# Patient Record
Sex: Female | Born: 1985
Health system: Southern US, Community
[De-identification: ages and names within clinical notes are randomized; demographics above are authoritative.]

## PROBLEM LIST (undated history)

## (undated) DIAGNOSIS — Z8489 Family history of other specified conditions: Secondary | ICD-10-CM

## (undated) DIAGNOSIS — T8859XA Other complications of anesthesia, initial encounter: Secondary | ICD-10-CM

## (undated) DIAGNOSIS — R519 Headache, unspecified: Secondary | ICD-10-CM

## (undated) DIAGNOSIS — M5126 Other intervertebral disc displacement, lumbar region: Secondary | ICD-10-CM

## (undated) DIAGNOSIS — M5136 Other intervertebral disc degeneration, lumbar region: Secondary | ICD-10-CM

## (undated) DIAGNOSIS — M51369 Other intervertebral disc degeneration, lumbar region without mention of lumbar back pain or lower extremity pain: Secondary | ICD-10-CM

## (undated) DIAGNOSIS — R51 Headache: Secondary | ICD-10-CM

## (undated) DIAGNOSIS — R112 Nausea with vomiting, unspecified: Secondary | ICD-10-CM

## (undated) DIAGNOSIS — F419 Anxiety disorder, unspecified: Secondary | ICD-10-CM

## (undated) DIAGNOSIS — T7840XA Allergy, unspecified, initial encounter: Secondary | ICD-10-CM

## (undated) DIAGNOSIS — T4145XA Adverse effect of unspecified anesthetic, initial encounter: Secondary | ICD-10-CM

## (undated) DIAGNOSIS — S3992XA Unspecified injury of lower back, initial encounter: Secondary | ICD-10-CM

## (undated) DIAGNOSIS — Z9889 Other specified postprocedural states: Secondary | ICD-10-CM

## (undated) DIAGNOSIS — K219 Gastro-esophageal reflux disease without esophagitis: Secondary | ICD-10-CM

## (undated) HISTORY — PX: BREAST SURGERY: SHX581

## (undated) HISTORY — DX: Other intervertebral disc degeneration, lumbar region: M51.36

## (undated) HISTORY — DX: Allergy, unspecified, initial encounter: T78.40XA

## (undated) HISTORY — DX: Other intervertebral disc displacement, lumbar region: M51.26

## (undated) HISTORY — DX: Other intervertebral disc degeneration, lumbar region without mention of lumbar back pain or lower extremity pain: M51.369

---
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
## 2010-12-18 ENCOUNTER — Emergency Department: Payer: Self-pay | Admitting: Emergency Medicine

## 2011-03-18 ENCOUNTER — Emergency Department: Payer: Self-pay | Admitting: Emergency Medicine

## 2012-05-03 ENCOUNTER — Emergency Department: Payer: Self-pay | Admitting: Emergency Medicine

## 2013-02-28 ENCOUNTER — Emergency Department: Payer: Self-pay | Admitting: Emergency Medicine

## 2013-02-28 LAB — CBC
HCT: 41.7 % (ref 35.0–47.0)
MCH: 29.6 pg (ref 26.0–34.0)
MCHC: 33.8 g/dL (ref 32.0–36.0)
RBC: 4.76 10*6/uL (ref 3.80–5.20)
RDW: 13.4 % (ref 11.5–14.5)
WBC: 7 10*3/uL (ref 3.6–11.0)

## 2013-02-28 LAB — BASIC METABOLIC PANEL
Anion Gap: 10 (ref 7–16)
BUN: 15 mg/dL (ref 7–18)
Calcium, Total: 9.6 mg/dL (ref 8.5–10.1)
Co2: 24 mmol/L (ref 21–32)
EGFR (Non-African Amer.): >60
Glucose: 81 mg/dL (ref 65–99)
Osmolality: 285 (ref 275–301)
Potassium: 3.3 mmol/L — ABNORMAL LOW (ref 3.5–5.1)

## 2013-02-28 LAB — MAGNESIUM: Magnesium: 2 mg/dL

## 2013-04-11 ENCOUNTER — Emergency Department: Payer: Self-pay | Admitting: Emergency Medicine

## 2013-04-11 LAB — URINALYSIS, COMPLETE
Bacteria: NONE SEEN
Bilirubin,UR: NEGATIVE
Blood: NEGATIVE
Glucose,UR: NEGATIVE mg/dL (ref 0–75)
LEUKOCYTE ESTERASE: NEGATIVE
Nitrite: NEGATIVE
PROTEIN: NEGATIVE
Ph: 6 (ref 4.5–8.0)
SPECIFIC GRAVITY: 1.031 (ref 1.003–1.030)
Squamous Epithelial: 3
WBC UR: 2 /HPF (ref 0–5)

## 2013-04-12 LAB — CBC WITH DIFFERENTIAL/PLATELET
BASOS PCT: 1.2 %
Basophil #: 0.1 10*3/uL (ref 0.0–0.1)
Eosinophil #: 0.3 10*3/uL (ref 0.0–0.7)
Eosinophil %: 3.8 %
HCT: 41.7 % (ref 35.0–47.0)
HGB: 13.9 g/dL (ref 12.0–16.0)
LYMPHS ABS: 3 10*3/uL (ref 1.0–3.6)
Lymphocyte %: 36 %
MCH: 30.2 pg (ref 26.0–34.0)
MCHC: 33.3 g/dL (ref 32.0–36.0)
MCV: 91 fL (ref 80–100)
Monocyte #: 0.7 x10 3/mm (ref 0.2–0.9)
Monocyte %: 8.6 %
NEUTROS ABS: 4.3 10*3/uL (ref 1.4–6.5)
Neutrophil %: 50.4 %
Platelet: 250 10*3/uL (ref 150–440)
RBC: 4.61 10*6/uL (ref 3.80–5.20)
RDW: 13.9 % (ref 11.5–14.5)
WBC: 8.4 10*3/uL (ref 3.6–11.0)

## 2013-04-12 LAB — COMPREHENSIVE METABOLIC PANEL
ALK PHOS: 64 U/L
Albumin: 3.8 g/dL (ref 3.4–5.0)
Anion Gap: 2 — ABNORMAL LOW (ref 7–16)
BUN: 14 mg/dL (ref 7–18)
Bilirubin,Total: 0.2 mg/dL (ref 0.2–1.0)
CHLORIDE: 106 mmol/L (ref 98–107)
Calcium, Total: 8.9 mg/dL (ref 8.5–10.1)
Co2: 29 mmol/L (ref 21–32)
Creatinine: 0.92 mg/dL (ref 0.60–1.30)
EGFR (Non-African Amer.): >60
Glucose: 74 mg/dL (ref 65–99)
Osmolality: 273 (ref 275–301)
Potassium: 3.5 mmol/L (ref 3.5–5.1)
SGOT(AST): 28 U/L (ref 15–37)
SGPT (ALT): 30 U/L (ref 12–78)
Sodium: 137 mmol/L (ref 136–145)
Total Protein: 6.6 g/dL (ref 6.4–8.2)

## 2013-04-12 LAB — LIPASE, BLOOD: LIPASE: 1006 U/L — AB (ref 73–393)

## 2013-05-07 ENCOUNTER — Emergency Department: Payer: Self-pay | Admitting: Internal Medicine

## 2013-05-07 LAB — URINALYSIS, COMPLETE
BILIRUBIN, UR: NEGATIVE
Bacteria: NONE SEEN
Blood: NEGATIVE
Glucose,UR: NEGATIVE mg/dL (ref 0–75)
NITRITE: NEGATIVE
PH: 5 (ref 4.5–8.0)
Specific Gravity: 1.028 (ref 1.003–1.030)
Squamous Epithelial: 8
WBC UR: 28 /HPF (ref 0–5)

## 2013-05-07 LAB — COMPREHENSIVE METABOLIC PANEL
ALBUMIN: 3.5 g/dL (ref 3.4–5.0)
ALK PHOS: 54 U/L
Anion Gap: 3 — ABNORMAL LOW (ref 7–16)
BILIRUBIN TOTAL: 0.5 mg/dL (ref 0.2–1.0)
BUN: 15 mg/dL (ref 7–18)
CHLORIDE: 106 mmol/L (ref 98–107)
Calcium, Total: 9 mg/dL (ref 8.5–10.1)
Co2: 28 mmol/L (ref 21–32)
Creatinine: 0.91 mg/dL (ref 0.60–1.30)
EGFR (African American): >60
EGFR (Non-African Amer.): >60
Glucose: 93 mg/dL (ref 65–99)
Osmolality: 274 (ref 275–301)
Potassium: 3.2 mmol/L — ABNORMAL LOW (ref 3.5–5.1)
SGOT(AST): 28 U/L (ref 15–37)
SGPT (ALT): 25 U/L (ref 12–78)
Sodium: 137 mmol/L (ref 136–145)
TOTAL PROTEIN: 6.6 g/dL (ref 6.4–8.2)

## 2013-05-07 LAB — CBC WITH DIFFERENTIAL/PLATELET
BASOS PCT: 0.6 %
Basophil #: 0 10*3/uL (ref 0.0–0.1)
EOS ABS: 0.1 10*3/uL (ref 0.0–0.7)
Eosinophil %: 1.7 %
HCT: 47.1 % — ABNORMAL HIGH (ref 35.0–47.0)
HGB: 15.3 g/dL (ref 12.0–16.0)
Lymphocyte #: 1.1 10*3/uL (ref 1.0–3.6)
Lymphocyte %: 26.5 %
MCH: 29.1 pg (ref 26.0–34.0)
MCHC: 32.4 g/dL (ref 32.0–36.0)
MCV: 90 fL (ref 80–100)
Monocyte #: 0.4 x10 3/mm (ref 0.2–0.9)
Monocyte %: 8.7 %
Neutrophil #: 2.5 10*3/uL (ref 1.4–6.5)
Neutrophil %: 62.5 %
Platelet: 171 10*3/uL (ref 150–440)
RBC: 5.25 10*6/uL — ABNORMAL HIGH (ref 3.80–5.20)
RDW: 13.4 % (ref 11.5–14.5)
WBC: 4 10*3/uL (ref 3.6–11.0)

## 2013-05-07 LAB — LIPASE, BLOOD: Lipase: 85 U/L (ref 73–393)

## 2013-05-09 LAB — URINE CULTURE

## 2013-10-09 ENCOUNTER — Emergency Department: Payer: Self-pay | Admitting: Emergency Medicine

## 2013-12-26 ENCOUNTER — Other Ambulatory Visit: Payer: Self-pay | Admitting: Certified Nurse Midwife

## 2013-12-26 LAB — CBC WITH DIFFERENTIAL/PLATELET
Basophil #: 0 10*3/uL (ref 0.0–0.1)
Basophil %: 0.5 %
EOS ABS: 0.1 10*3/uL (ref 0.0–0.7)
Eosinophil %: 0.8 %
HCT: 40.3 % (ref 35.0–47.0)
HGB: 13.5 g/dL (ref 12.0–16.0)
LYMPHS PCT: 15.7 %
Lymphocyte #: 1.3 10*3/uL (ref 1.0–3.6)
MCH: 30.4 pg (ref 26.0–34.0)
MCHC: 33.5 g/dL (ref 32.0–36.0)
MCV: 91 fL (ref 80–100)
MONO ABS: 0.5 x10 3/mm (ref 0.2–0.9)
Monocyte %: 6.5 %
Neutrophil #: 6.3 10*3/uL (ref 1.4–6.5)
Neutrophil %: 76.5 %
Platelet: 203 10*3/uL (ref 150–440)
RBC: 4.44 10*6/uL (ref 3.80–5.20)
RDW: 13 % (ref 11.5–14.5)
WBC: 8.2 10*3/uL (ref 3.6–11.0)

## 2013-12-28 LAB — URINE CULTURE

## 2014-01-09 ENCOUNTER — Emergency Department: Payer: Self-pay | Admitting: Emergency Medicine

## 2014-01-09 LAB — COMPREHENSIVE METABOLIC PANEL
ALK PHOS: 38 U/L — AB
AST: 20 U/L (ref 15–37)
Albumin: 3.8 g/dL (ref 3.4–5.0)
Anion Gap: 10 (ref 7–16)
BUN: 17 mg/dL (ref 7–18)
Bilirubin,Total: 0.2 mg/dL (ref 0.2–1.0)
CALCIUM: 8.6 mg/dL (ref 8.5–10.1)
CO2: 24 mmol/L (ref 21–32)
CREATININE: 0.74 mg/dL (ref 0.60–1.30)
Chloride: 103 mmol/L (ref 98–107)
EGFR (African American): >60
Glucose: 81 mg/dL (ref 65–99)
OSMOLALITY: 274 (ref 275–301)
Potassium: 3.7 mmol/L (ref 3.5–5.1)
SGPT (ALT): 36 U/L
SODIUM: 137 mmol/L (ref 136–145)
Total Protein: 7 g/dL (ref 6.4–8.2)

## 2014-01-09 LAB — URINALYSIS, COMPLETE
BILIRUBIN, UR: NEGATIVE
BLOOD: NEGATIVE
Bacteria: NONE SEEN
GLUCOSE, UR: NEGATIVE mg/dL (ref 0–75)
LEUKOCYTE ESTERASE: NEGATIVE
Nitrite: NEGATIVE
PROTEIN: NEGATIVE
Ph: 6 (ref 4.5–8.0)
RBC,UR: 1 /HPF (ref 0–5)
SPECIFIC GRAVITY: 1.025 (ref 1.003–1.030)
Squamous Epithelial: 1
WBC UR: 2 /HPF (ref 0–5)

## 2014-01-09 LAB — CBC
HCT: 38.6 % (ref 35.0–47.0)
HGB: 12.5 g/dL (ref 12.0–16.0)
MCH: 29.7 pg (ref 26.0–34.0)
MCHC: 32.4 g/dL (ref 32.0–36.0)
MCV: 92 fL (ref 80–100)
Platelet: 215 10*3/uL (ref 150–440)
RBC: 4.21 10*6/uL (ref 3.80–5.20)
RDW: 13.6 % (ref 11.5–14.5)
WBC: 9 10*3/uL (ref 3.6–11.0)

## 2014-01-09 LAB — HCG, QUANTITATIVE, PREGNANCY: Beta Hcg, Quant.: 60933 m[IU]/mL — ABNORMAL HIGH

## 2014-07-23 ENCOUNTER — Emergency Department
Admit: 2014-07-23 | Disposition: A | Discharge status: Home / Self Care | Payer: Self-pay | Admitting: Emergency Medicine

## 2014-07-23 ENCOUNTER — Inpatient Hospital Stay
Admit: 2014-07-23 | Disposition: A | Discharge status: Home / Self Care | Payer: Self-pay | Attending: Certified Nurse Midwife | Admitting: Certified Nurse Midwife

## 2014-07-23 LAB — CBC WITH DIFFERENTIAL/PLATELET
BASOS PCT: 0.4 %
Basophil #: 0 10*3/uL (ref 0.0–0.1)
EOS PCT: 0.6 %
Eosinophil #: 0.1 10*3/uL (ref 0.0–0.7)
HCT: 38.1 % (ref 35.0–47.0)
HGB: 12.4 g/dL (ref 12.0–16.0)
LYMPHS ABS: 1.9 10*3/uL (ref 1.0–3.6)
Lymphocyte %: 18 %
MCH: 28.6 pg (ref 26.0–34.0)
MCHC: 32.5 g/dL (ref 32.0–36.0)
MCV: 88 fL (ref 80–100)
MONO ABS: 0.6 x10 3/mm (ref 0.2–0.9)
MONOS PCT: 5.8 %
NEUTROS ABS: 7.9 10*3/uL — AB (ref 1.4–6.5)
Neutrophil %: 75.2 %
Platelet: 229 10*3/uL (ref 150–440)
RBC: 4.33 10*6/uL (ref 3.80–5.20)
RDW: 14.4 % (ref 11.5–14.5)
WBC: 10.5 10*3/uL (ref 3.6–11.0)

## 2014-07-23 LAB — DRUG SCREEN, URINE
AMPHETAMINES, UR SCREEN: NEGATIVE
Barbiturates, Ur Screen: NEGATIVE
Benzodiazepine, Ur Scrn: NEGATIVE
Cannabinoid 50 Ng, Ur ~~LOC~~: POSITIVE
Cocaine Metabolite,Ur ~~LOC~~: NEGATIVE
MDMA (ECSTASY) UR SCREEN: NEGATIVE
Methadone, Ur Screen: NEGATIVE
OPIATE, UR SCREEN: NEGATIVE
Phencyclidine (PCP) Ur S: NEGATIVE
TRICYCLIC, UR SCREEN: NEGATIVE

## 2014-07-23 LAB — HEMATOCRIT: HCT: 36.7 % (ref 35.0–47.0)

## 2014-07-24 LAB — HEMATOCRIT: HCT: 28 % — AB (ref 35.0–47.0)

## 2014-08-01 ENCOUNTER — Emergency Department (HOSPITAL_COMMUNITY): Admission: EM | Admit: 2014-08-01 | Discharge: 2014-08-01 | Discharge status: Absent without leave | Payer: Self-pay

## 2014-08-03 LAB — SURGICAL PATHOLOGY

## 2014-08-09 NOTE — Op Note (Signed)
PATIENT NAME:  Leslie [name], Leslie [name] MR#:  [id number] DATE OF BIRTH:  1986-01-13  DATE OF PROCEDURE:  07/23/2014  PREOPERATIVE DIAGNOSES:  1.  Intrauterine pregnancy at 37 weeks and 3 days.  2.  Prolonged rupture of membranes.  3.  History of prior cesarean section and desire for repeat.  4.  Scoliosis  POSTOPERATIVE DIAGNOSES:  1.  Intrauterine pregnancy at 37 weeks and 3 days.  2.  Prolonged rupture of membranes.  3.  History of prior cesarean section and desire for repeat.  4.  Scoliosis 5.  Questionable chorioamnionitis.   PROCEDURE: Repeat low transverse cesarean section with double layer uterine closure via Joel-Cohen technique.   SURGEON:  Bing, MD  ASSISTANT: Channing Mutters  ANESTHESIA: General.   ESTIMATED BLOOD LOSS: 900 mL.   INTRAVENOUS FLUIDS: 1500 mL.   URINE OUTPUT: 60 mL.   ANTIBIOTICS: Clindamycin and gentamicin given preoperatively.   VENOUS THROMBOEMBOLISM PROPHYLAXIS: SCDs applied to bilateral lower extremities.   SPECIMENS: Placenta to pathology.   COMPLICATIONS: Inadequate spinal anesthesia necessitating need for general. Also, the infant was not engaged and had to be delivered breech. Two left O'Leary stitch placed was placed.  FINDINGS: Grossly normal uterus, tubes, and ovaries bilaterally. Cephalic female infant with clear amniotic fluid with slightly malodorous smelling amniotic fluid. Apgars of 8 and 9. Weight 6 pounds 14 ounces. Bleeding was noted at the left aspect of the hysterotomy which was repaired with 2 O'Leary stitches.   DESCRIPTION OF PROCEDURE: The patient was taken to the operating room where the spinal anesthesia was administered. She was then prepped and draped in normal sterile fashion in dorsal supine position with a leftward tilt. The anesthesia was tested and noted to be inadequate, so general anesthesia was then performed. Next, via the Joel-Cohen technique, the abdomen was entered and a low transverse hysterotomy was made with the  scalpel, after placement of the bladder blade. The uterine cavity was then entered and clear amniotic fluid noted, although it was slightly malodorous smelling. The infant was noted to be nonengaged in a cephalic presentation and unable to bring the infant's head to the hysterotomy, and the fetus slipped to transverse. Given this, the decision was made to proceed with breech extraction so the lower extremities were then grasped and the infant was delivered via the standard breech maneuvers. The cord was clamped x2 and cut and the infant handed to the awaiting pediatricians. The placenta was then gradually expressed from the uterus without difficulty, and the uterus was then exteriorized and cleared of all clots and debris. Next, the hysterotomy was repaired with a running locking suture of 0 Monocryl and a second imbricating suture was then placed. Oozing and bleeding was noted at the left aspect of the hysterotomy near the broad ligament so 2 O'Leary stitches were then placed for excellent hemostasis. The uterus was then returned to the abdomen and the abdomen was then copiously irrigated. The hysterotomy was reinspected and felt to be hemostatic. The peritoneum was unable to be reclosed due to scanty tissue. Next, the fascia was then closed with a running suture of 0 Vicryl on the left half. During this there seemed to be accumulation of either runoff or possibly active bleeding so the fascial suture was re opened and the uterus was then reinspected and re-exteriorized and all aspects of the uterus and the bladder flap that had been created during the Joel-Cohen technique were all inspected and no bleeding was noted. The uterus was then returned to the abdomen,  and the abdomen irrigated, and all operative sites were then reinspected and no bleeding was noted after looking for several minutes. The fascia was then closed with a running suture of 0 Vicryl bilaterally and the subcutaneous layer closed with several  figure-of-eight sutures of 2-0 plain gut after loosening up of the old skin incision from the subcutaneous layer. The skin incision was then closed with 4-0 Monocryl in a running subcutaneous fashion. Sponge, lap, needle, and instrument counts were correct x2. The patient was taken to recovery room awake, alert, breathing independently in stable condition.   I told the pediatrics team about the concern for chorioamnionitis, based on the operative findings.    ____________________________ Verona Bing, MD cp:sb D: 07/24/2014 05:25:08 ET T: 07/24/2014 11:01:22 ET JOB#: [id number]  cc: Roosevelt Bing, MD, <Dictator> Moulton Bing MD ELECTRONICALLY SIGNED 07/28/2014 10:38
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
## 2014-12-27 ENCOUNTER — Emergency Department
Admission: EM | Admit: 2014-12-27 | Discharge: 2014-12-27 | Disposition: A | Discharge status: Home / Self Care | Payer: Medicaid Other | Attending: Emergency Medicine | Admitting: Emergency Medicine

## 2014-12-27 ENCOUNTER — Encounter: Payer: Self-pay | Admitting: *Deleted

## 2014-12-27 DIAGNOSIS — S39012A Strain of muscle, fascia and tendon of lower back, initial encounter: Secondary | ICD-10-CM | POA: Diagnosis not present

## 2014-12-27 DIAGNOSIS — Y9289 Other specified places as the place of occurrence of the external cause: Secondary | ICD-10-CM | POA: Diagnosis not present

## 2014-12-27 DIAGNOSIS — Z720 Tobacco use: Secondary | ICD-10-CM | POA: Diagnosis not present

## 2014-12-27 DIAGNOSIS — Y9389 Activity, other specified: Secondary | ICD-10-CM | POA: Insufficient documentation

## 2014-12-27 DIAGNOSIS — Z880 Allergy status to penicillin: Secondary | ICD-10-CM | POA: Diagnosis not present

## 2014-12-27 DIAGNOSIS — X58XXXA Exposure to other specified factors, initial encounter: Secondary | ICD-10-CM | POA: Insufficient documentation

## 2014-12-27 DIAGNOSIS — Z79899 Other long term (current) drug therapy: Secondary | ICD-10-CM | POA: Diagnosis not present

## 2014-12-27 DIAGNOSIS — Y998 Other external cause status: Secondary | ICD-10-CM | POA: Diagnosis not present

## 2014-12-27 DIAGNOSIS — S3992XA Unspecified injury of lower back, initial encounter: Secondary | ICD-10-CM | POA: Diagnosis present

## 2014-12-27 HISTORY — DX: Unspecified injury of lower back, initial encounter: S39.92XA

## 2014-12-27 MED ORDER — ORPHENADRINE CITRATE ER 100 MG PO TB12: 100.0000 mg | ORAL_TABLET | Freq: Two times a day (BID) | ORAL | Status: DC | PRN

## 2014-12-27 MED ORDER — NABUMETONE 750 MG PO TABS: 750.0000 mg | ORAL_TABLET | Freq: Two times a day (BID) | ORAL | Status: DC

## 2014-12-27 MED ORDER — TRAMADOL HCL 50 MG PO TABS: 50.0000 mg | ORAL_TABLET | Freq: Two times a day (BID) | ORAL | Status: DC

## 2014-12-27 MED ORDER — HYDROCODONE-ACETAMINOPHEN 5-325 MG PO TABS
1.0000 | ORAL_TABLET | Freq: Once | ORAL | Status: AC
  Administered 2014-12-27: 1 via ORAL

## 2014-12-27 MED ORDER — HYDROCODONE-ACETAMINOPHEN 5-325 MG PO TABS
ORAL_TABLET | ORAL | Status: AC
  Filled 2014-12-27: qty 1

## 2014-12-27 MED ORDER — ORPHENADRINE CITRATE 30 MG/ML IJ SOLN
60.0000 mg | INTRAMUSCULAR | Status: AC
  Administered 2014-12-27: 60 mg via INTRAMUSCULAR
  Filled 2014-12-27: qty 2
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
## 2014-12-27 NOTE — ED Notes (Signed)
AaoX3. SKIN WARM AND DRY.  CRYING DUE TO PAIN.  EMOTIONAL SUPPORT GIVEN, WELL RECEIVED.

## 2014-12-27 NOTE — ED Notes (Signed)
Pt states she bent down to pick up something up at work and when she stood up she had sudden onset of back pain and now states pain pain radiating down right leg

## 2014-12-27 NOTE — Discharge Instructions (Signed)
Lumbosacral Strain Lumbosacral strain is a strain of any of the parts that make up your lumbosacral vertebrae. Your lumbosacral vertebrae are the bones that make up the lower third of your backbone. Your lumbosacral vertebrae are held together by muscles and tough, fibrous tissue (ligaments).  CAUSES  A sudden blow to your back can cause lumbosacral strain. Also, anything that causes an excessive stretch of the muscles in the low back can cause this strain. This is typically seen when people exert themselves strenuously, fall, lift heavy objects, bend, or crouch repeatedly. RISK FACTORS  Physically demanding work.  Participation in pushing or pulling sports or sports that require a sudden twist of the back (tennis, golf, baseball).  Weight lifting.  Excessive lower back curvature.  Forward-tilted pelvis.  Weak back or abdominal muscles or both.  Tight hamstrings. SIGNS AND SYMPTOMS  Lumbosacral strain may cause pain in the area of your injury or pain that moves (radiates) down your leg.  DIAGNOSIS Your health care provider can often diagnose lumbosacral strain through a physical exam. In some cases, you may need tests such as X-ray exams.  TREATMENT  Treatment for your lower back injury depends on many factors that your clinician will have to evaluate. However, most treatment will include the use of anti-inflammatory medicines. HOME CARE INSTRUCTIONS   Avoid hard physical activities (tennis, racquetball, waterskiing) if you are not in proper physical condition for it. This may aggravate or create problems.  If you have a back problem, avoid sports requiring sudden body movements. Swimming and walking are generally safer activities.  Maintain good posture.  Maintain a healthy weight.  For acute conditions, you may put ice on the injured area.  Put ice in a plastic bag.  Place a towel between your skin and the bag.  Leave the ice on for 20 minutes, 2-3 times a day.  When the  low back starts healing, stretching and strengthening exercises may be recommended. SEEK MEDICAL CARE IF:  Your back pain is getting worse.  You experience severe back pain not relieved with medicines. SEEK IMMEDIATE MEDICAL CARE IF:   You have numbness, tingling, weakness, or problems with the use of your arms or legs.  There is a change in bowel or bladder control.  You have increasing pain in any area of the body, including your belly (abdomen).  You notice shortness of breath, dizziness, or feel faint.  You feel sick to your stomach (nauseous), are throwing up (vomiting), or become sweaty.  You notice discoloration of your toes or legs, or your feet get very cold. MAKE SURE YOU:   Understand these instructions.  Will watch your condition.  Will get help right away if you are not doing well or get worse. Document Released: 01/04/2005 Document Revised: 04/01/2013 Document Reviewed: 11/13/2012 [facility] Patient Information 2015 [place]. This information is not intended to replace advice given to you by your health care provider. Make sure you discuss any questions you have with your health care provider.  Back Pain, Adult Back pain is very common. The pain often gets better over time. The cause of back pain is usually not dangerous. Most people can learn to manage their back pain on their own.  HOME CARE   Stay active. Start with short walks on flat ground if you can. Try to walk farther each day.  Do not sit, drive, or stand in one place for more than 30 minutes. Do not stay in bed.  Do not avoid exercise or  work. Activity can help your back heal faster.  Be careful when you bend or lift an object. Bend at your knees, keep the object close to you, and do not twist.  Sleep on a firm mattress. Lie on your side, and bend your knees. If you lie on your back, put a pillow under your knees.  Only take medicines as told by your doctor.  Put ice on the injured  area.  Put ice in a plastic bag.  Place a towel between your skin and the bag.  Leave the ice on for 15-20 minutes, 03-04 times a day for the first 2 to 3 days. After that, you can switch between ice and heat packs.  Ask your doctor about back exercises or massage.  Avoid feeling anxious or stressed. Find good ways to deal with stress, such as exercise. GET HELP RIGHT AWAY IF:   Your pain does not go away with rest or medicine.  Your pain does not go away in 1 week.  You have new problems.  You do not feel well.  The pain spreads into your legs.  You cannot control when you poop (bowel movement) or pee (urinate).  Your arms or legs feel weak or lose feeling (numbness).  You feel sick to your stomach (nauseous) or throw up (vomit).  You have belly (abdominal) pain.  You feel like you may pass out (faint). MAKE SURE YOU:   Understand these instructions.  Will watch your condition.  Will get help right away if you are not doing well or get worse. Document Released: 09/13/2007 Document Revised: 06/19/2011 Document Reviewed: 07/29/2013 [facility] Patient Information 2015 [place], [place]. This information is not intended to replace advice given to you by your health care provider. Make sure you discuss any questions you have with your health care provider.  Take the prescription meds as directed.  Apply ice to reduce symptoms.  Follow-up with [facility] or your provider for continued symptoms.

## 2015-01-07 DIAGNOSIS — M5417 Radiculopathy, lumbosacral region: Secondary | ICD-10-CM | POA: Insufficient documentation

## 2015-10-05 ENCOUNTER — Encounter: Payer: Self-pay | Admitting: *Deleted

## 2015-10-20 ENCOUNTER — Encounter: Payer: Self-pay | Admitting: *Deleted

## 2015-10-21 ENCOUNTER — Ambulatory Visit: Payer: Self-pay | Admitting: General Surgery

## 2015-10-21 ENCOUNTER — Encounter: Payer: Self-pay | Admitting: General Surgery

## 2015-10-21 ENCOUNTER — Ambulatory Visit (INDEPENDENT_AMBULATORY_CARE_PROVIDER_SITE_OTHER): Payer: BLUE CROSS/BLUE SHIELD | Admitting: General Surgery

## 2015-10-21 DIAGNOSIS — R1011 Right upper quadrant pain: Secondary | ICD-10-CM | POA: Diagnosis not present

## 2015-10-21 MED ORDER — PANTOPRAZOLE SODIUM 40 MG PO TBEC: 40.0000 mg | DELAYED_RELEASE_TABLET | Freq: Every day | ORAL | Status: DC

## 2015-10-21 NOTE — Progress Notes (Signed)
Patient ID: Leslie [name], female   DOB: 09/09/1985, 30 y.o.   MRN: [id number]  Chief Complaint  Patient presents with  . Abdominal Pain    HPI Leslie [name] is a 30 y.o. female here today for an evaluation for abdominal pain. The pain is located in the right quadrant of her abdomen and the pain  HPI  Past Medical History:  Diagnosis Date  . Back injury   . Bulging lumbar disc     Past Surgical History:  Procedure Laterality Date  . CESAREAN SECTION  2005  . CESAREAN SECTION  2016  . CESAREAN SECTION  2005, 2016  . TONSILLECTOMY AND ADENOIDECTOMY  1987  . TONSILLECTOMY AND ADENOIDECTOMY  1987    Family History  Problem Relation Age of Onset  . Diabetes Mother     Social History Social History  Substance Use Topics  . Smoking status: Current Every Day Smoker    Packs/day: 0.50    Years: 15.00    Types: Cigarettes  . Smokeless tobacco: Never Used  . Alcohol use 0.0 oz/week     Comment: wine daily    Allergies  Allergen Reactions  . Amoxicillin Hives  . Bactrim [Sulfamethoxazole-Trimethoprim] Hives  . Penicillins   . Penicillins Hives    Current Outpatient Prescriptions  Medication Sig Dispense Refill  . gabapentin (NEURONTIN) 300 MG capsule   0  . doxycycline (VIBRA-TABS) 100 MG tablet Take 1 tablet (100 mg total) by mouth 2 (two) times daily. 20 tablet 0  . gabapentin (NEURONTIN) 300 MG capsule Take 300 mg by mouth 3 (three) times daily.    Marland Kitchen. HYDROcodone-acetaminophen (NORCO) 5-325 MG tablet Take 1 tablet by mouth every 6 (six) hours as needed. 10 tablet 0  . pantoprazole (PROTONIX) 40 MG tablet Take 1 tablet (40 mg total) by mouth at bedtime. 30 tablet 6  . ranitidine (ZANTAC) 75 MG tablet Take 75 mg by mouth 2 (two) times daily.     No current facility-administered medications for this visit.     Review of Systems Review of Systems  Constitutional: Negative.   Respiratory: Negative.   Cardiovascular: Negative.   Gastrointestinal: Positive  for abdominal pain.    Last menstrual period 08/09/2015.  Physical Exam Physical Exam  Constitutional: She is oriented to person, place, and time. She appears well-developed and well-nourished.  Neurological: She is alert and oriented to person, place, and time.  Skin: Skin is warm and dry.       Assessment      Plan          PCP: Dr. Zara ChessLarry Sykes  Earline MayotteByrnett, Loxley Schmale W 11/04/2015, 8:48 PM

## 2015-10-21 NOTE — Progress Notes (Signed)
Patient ID: Leslie GowdaLydia Amanda [name], female   DOB: 01/16/1986, 30 y.o.   MRN: [id number]  Chief Complaint  Patient presents with  . Other    gall bladder    HPI Leslie [name] is a 30 y.o. female.  Here today for evaluation of her gall bladder. She is having mid upper abdominal pain that is tender to touch. The pain has radiated to her back. She is loose diarrhea, she has been 5 times this morning (11 AM appointment). She is nauseated almost every day with some vomiting. She has been having these symptoms for 2 months. She admits to chills with sweats. She has found that onions, fatty meats, ice cream and grease make her symptoms worse. She recently got married, May, and she had similar pains on the honeymoon after eating at a buffet.  She has noticed that with the pain she has dark urine. The dark urine may proceed the onset of her pain. This usually resolves within 24 hours. She has not been aware of any change in the color of her sclera.  She had her first child at age 30. Second child at age 30.  I personally reviewed the patient's history.  HPI  Past Medical History  Diagnosis Date  . Back injury   . Bulging lumbar disc     Past Surgical History  Procedure Laterality Date  . Cesarean section  2005  . Cesarean section  2016  . Tonsillectomy and adenoidectomy  1987  . Cesarean section  2005, 2016  . Tonsillectomy and adenoidectomy  1987    Family History  Problem Relation Age of Onset  . Diabetes Mother     Social History Social History  Substance Use Topics  . Smoking status: Current Every Day Smoker -- 0.50 packs/day for 15 years    Types: Cigarettes  . Smokeless tobacco: Never Used  . Alcohol Use: 0.0 oz/week    0 Standard drinks or equivalent per week     Comment: wine daily    Allergies  Allergen Reactions  . Amoxicillin Hives  . Bactrim [Sulfamethoxazole-Trimethoprim] Hives  . Penicillins   . Penicillins Hives    Current Outpatient Prescriptions   Medication Sig Dispense Refill  . gabapentin (NEURONTIN) 300 MG capsule Take 300 mg by mouth 3 (three) times daily.    . ranitidine (ZANTAC) 75 MG tablet Take 75 mg by mouth 2 (two) times daily.    Marland Kitchen. gabapentin (NEURONTIN) 300 MG capsule   0  . pantoprazole (PROTONIX) 40 MG tablet Take 1 tablet (40 mg total) by mouth at bedtime. 30 tablet 6   No current facility-administered medications for this visit.    Review of Systems Review of Systems  Constitutional: Positive for chills and appetite change.  Respiratory: Negative.   Cardiovascular: Negative.   Gastrointestinal: Positive for nausea, vomiting, abdominal pain and diarrhea.    Blood pressure 110/68, pulse 66, resp. rate 12, height 5\' 6"  (1.676 m), weight 112 lb (50.803 kg), last menstrual period 08/09/2015. LMP 08-09-15 (Patient reports she has an IUD).   Physical Exam Physical Exam  Constitutional: She is oriented to person, place, and time. She appears well-developed and well-nourished.  HENT:  Mouth/Throat: Oropharynx is clear and moist.  Eyes: Conjunctivae are normal. No scleral icterus.  Neck: Neck supple.  Cardiovascular: Normal rate, regular rhythm and normal heart sounds.   Pulmonary/Chest: Effort normal and breath sounds normal.  Abdominal: Soft. Normal appearance and bowel sounds are normal. There is no tenderness.  Lymphadenopathy:  She has no cervical adenopathy.  Neurological: She is alert and oriented to person, place, and time.  Skin: Skin is warm and dry.  Psychiatric: Her behavior is normal.    Data Reviewed PCP notes.  Assessment    Symptoms suggestive of biliary colic.    Plan    Diagnostic imaging and laboratory studies ordered.  Considering she continues to have reflux symptoms making use of OTC Zantac, will make use of a trial of Protonix.    Schedule gallbladder ultrasound and HIDA scan with [facility]. This has been arranged at [facility] for 11-04-15 at 8 am (arrive 7:45 am).  Prep: NPO after midnight.  Patient to have the following labs drawn at [facility] today: CBC and Hepatic function panel.  Patient to stop zantac and try protonix.   PCP/Ref:  Deri Fuelling 10/21/2015, 9:28 PM

## 2015-10-21 NOTE — Patient Instructions (Addendum)
  The patient is aware to call back for any questions or concerns. Schedule ultrasound  Draw labs

## 2015-10-22 LAB — HEPATIC FUNCTION PANEL
ALT: 16 IU/L (ref 0–32)
AST: 18 IU/L (ref 0–40)
Albumin: 4.5 g/dL (ref 3.5–5.5)
Alkaline Phosphatase: 52 IU/L (ref 39–117)
Bilirubin Total: 0.3 mg/dL (ref 0.0–1.2)
Bilirubin, Direct: 0.09 mg/dL (ref 0.00–0.40)
TOTAL PROTEIN: 6.7 g/dL (ref 6.0–8.5)

## 2015-10-22 LAB — CBC WITH DIFFERENTIAL/PLATELET
BASOS ABS: 0 10*3/uL (ref 0.0–0.2)
Basos: 1 %
EOS (ABSOLUTE): 0.3 10*3/uL (ref 0.0–0.4)
Eos: 4 %
Hematocrit: 40.1 % (ref 34.0–46.6)
Hemoglobin: 13.3 g/dL (ref 11.1–15.9)
Immature Grans (Abs): 0 10*3/uL (ref 0.0–0.1)
Immature Granulocytes: 0 %
Lymphocytes Absolute: 2.1 10*3/uL (ref 0.7–3.1)
Lymphs: 28 %
MCH: 29.8 pg (ref 26.6–33.0)
MCHC: 33.2 g/dL (ref 31.5–35.7)
MCV: 90 fL (ref 79–97)
MONOCYTES: 8 %
Monocytes Absolute: 0.6 10*3/uL (ref 0.1–0.9)
Neutrophils Absolute: 4.4 10*3/uL (ref 1.4–7.0)
Neutrophils: 59 %
Platelets: 237 10*3/uL (ref 150–379)
RBC: 4.46 x10E6/uL (ref 3.77–5.28)
RDW: 14.3 % (ref 12.3–15.4)
WBC: 7.4 10*3/uL (ref 3.4–10.8)

## 2015-11-02 ENCOUNTER — Emergency Department
Admission: EM | Admit: 2015-11-02 | Discharge: 2015-11-02 | Disposition: A | Discharge status: Home / Self Care | Payer: BLUE CROSS/BLUE SHIELD | Attending: Emergency Medicine | Admitting: Emergency Medicine

## 2015-11-02 DIAGNOSIS — Z23 Encounter for immunization: Secondary | ICD-10-CM | POA: Diagnosis not present

## 2015-11-02 DIAGNOSIS — H6002 Abscess of left external ear: Secondary | ICD-10-CM | POA: Insufficient documentation

## 2015-11-02 DIAGNOSIS — Z0189 Encounter for other specified special examinations: Secondary | ICD-10-CM

## 2015-11-02 DIAGNOSIS — L723 Sebaceous cyst: Secondary | ICD-10-CM

## 2015-11-02 DIAGNOSIS — F17210 Nicotine dependence, cigarettes, uncomplicated: Secondary | ICD-10-CM | POA: Insufficient documentation

## 2015-11-02 DIAGNOSIS — L089 Local infection of the skin and subcutaneous tissue, unspecified: Secondary | ICD-10-CM

## 2015-11-02 DIAGNOSIS — Z7689 Persons encountering health services in other specified circumstances: Secondary | ICD-10-CM

## 2015-11-02 MED ORDER — LIDOCAINE-EPINEPHRINE (PF) 1 %-1:200000 IJ SOLN
30.0000 mL | Freq: Once | INTRAMUSCULAR | Status: AC
  Administered 2015-11-02: 30 mL via INTRADERMAL
  Filled 2015-11-02: qty 30

## 2015-11-02 MED ORDER — HYDROCODONE-ACETAMINOPHEN 5-325 MG PO TABS
1.0000 | ORAL_TABLET | Freq: Once | ORAL | Status: AC
  Administered 2015-11-02: 1 via ORAL
  Filled 2015-11-02: qty 1

## 2015-11-02 MED ORDER — DOXYCYCLINE HYCLATE 100 MG PO TABS: 100.0000 mg | ORAL_TABLET | Freq: Two times a day (BID) | ORAL | 0 refills | Status: DC

## 2015-11-02 MED ORDER — HYDROCODONE-ACETAMINOPHEN 5-325 MG PO TABS: 1.0000 | ORAL_TABLET | Freq: Four times a day (QID) | ORAL | 0 refills | Status: DC | PRN

## 2015-11-02 MED ORDER — TETANUS-DIPHTH-ACELL PERTUSSIS 5-2.5-18.5 LF-MCG/0.5 IM SUSP
0.5000 mL | Freq: Once | INTRAMUSCULAR | Status: AC
  Administered 2015-11-02: 0.5 mL via INTRAMUSCULAR
  Filled 2015-11-02: qty 0.5

## 2015-11-02 MED ORDER — DOXYCYCLINE HYCLATE 100 MG PO TABS
100.0000 mg | ORAL_TABLET | Freq: Once | ORAL | Status: AC
  Administered 2015-11-02: 100 mg via ORAL
  Filled 2015-11-02: qty 1

## 2015-11-02 NOTE — ED Provider Notes (Signed)
[facility] Emergency Department Provider Note ____________________________________________  Time seen: 2127  I have reviewed the triage vital signs and the nursing notes.  HISTORY  Chief Complaint  Facial Swelling  HPI Leslie [name] is a 30 y.o. female resistance to the ED for evaluation of an earlobe cyst that appears to be acutely infected and inflamed. She describes it about a day and a half ago she noted sudden onset of increased swelling to face previously stable cyst or nodule behind her left earlobe. Since that time she noted increased tenderness, redness, and tightness. She reports the area feels normal and firm. She denies any interim fevers, chills, sweats, or spontaneous drainage.She rates her discomfort as a 6/10 in triage. She hasn't on known tetanus status at this time.  Past Medical History:  Diagnosis Date  . Back injury   . Bulging lumbar disc     Patient Active Problem List   Diagnosis Date Noted  . Abdominal pain, right upper quadrant 10/21/2015    Past Surgical History:  Procedure Laterality Date  . CESAREAN SECTION  2005  . CESAREAN SECTION  2016  . CESAREAN SECTION  2005, 2016  . TONSILLECTOMY AND ADENOIDECTOMY  1987  . TONSILLECTOMY AND ADENOIDECTOMY  1987    Current Outpatient Rx  . Order #: [id number] Class: Print  . Order #: [id number] Class: Historical Med  . Order #: [id number] Class: Historical Med  . Order #: [id number] Class: Print  . Order #: [id number] Class: Normal  . Order #: [id number] Class: Historical Med   Allergies Amoxicillin; Bactrim [sulfamethoxazole-trimethoprim]; Penicillins; and Penicillins  Family History  Problem Relation Age of Onset  . Diabetes Mother     Social History Social History  Substance Use Topics  . Smoking status: Current Every Day Smoker    Packs/day: 0.50    Years: 15.00    Types: Cigarettes  . Smokeless tobacco: Never Used  . Alcohol use 0.0 oz/week     Comment: wine daily    Review of Systems  Constitutional: Negative for fever. Eyes: Negative for visual changes. ENT: Negative for sore throat. Left earlobe cyst as above.  Skin: Negative for rash. Neurological: Negative for headaches, focal weakness or numbness. ____________________________________________  PHYSICAL EXAM:  VITAL SIGNS: ED Triage Vitals  Enc Vitals Group     BP 11/02/15 2051 123/78     Pulse Rate 11/02/15 2051 88     Resp 11/02/15 2051 18     Temp 11/02/15 2051 98.4 F (36.9 C)     Temp Source 11/02/15 2051 Oral     SpO2 11/02/15 2051 98 %     Weight 11/02/15 2051 110 lb (49.9 kg)     Height 11/02/15 2051 5\' 6"  (1.676 m)     Head Circumference --      Peak Flow --      Pain Score 11/02/15 2105 6     Pain Loc --      Pain Edu? --      Excl. in GC? --     Constitutional: Alert and oriented. Well appearing and in no distress. Head: Normocephalic and atraumatic.      Eyes: Conjunctivae are normal. PERRL. Normal extraocular movements      Ears: Canals clear. TMs intact bilaterally. Left earlobe with a  Posteriorly-located infected sebaceous cyst. The cyst is tense, erythematous, and tender.     Neck: Supple. No thyromegaly. Respiratory: Normal respiratory effort. No wheezes/rales/rhonchi. Musculoskeletal: Nontender with normal range of motion in all extremities.  Neurologic:  Normal  gait without ataxia. Normal speech and language. No gross focal neurologic deficits are appreciated. Skin:  Skin is warm, dry and intact. No rash noted. ____________________________________________  PROCEDURES  Tdap 0.5 mg IM Doxycycline 100 mg PO Norco 5-325 mg PO  INCISION AND DRAINAGE Performed by: Lissa Hoard Consent: Verbal consent obtained. Risks and benefits: risks, benefits and alternatives were discussed Type: abscess  Body area: left earlobe cyst  Anesthesia: local infiltration  Incision was made with a scalpel.  Local anesthetic: lidocaine 1% w/  epinephrine  Anesthetic total: 1.5 ml  Complexity: complex Blunt dissection to break up loculations  Drainage: purulent  Drainage amount: moderate  Packing material: 1/4 in iodoform gauze  Patient tolerance: Patient tolerated the procedure well with no immediate complications. ____________________________________________  INITIAL IMPRESSION / ASSESSMENT AND PLAN / ED COURSE  Patient with an encounter for an I&D procedure for an infected sebaceous cyst to the left earlobe. Tolerated procedure well and has been discharged with discussions for doxycycline and hydrocodone. She will follow with primary care provider in 2-3 days for packing removal. Wound care structures are provided and return precautions are reviewed.  Clinical Course   ____________________________________________  FINAL CLINICAL IMPRESSION(S) / ED DIAGNOSES  Final diagnoses:  Infected sebaceous cyst  Encounter for incision and drainage procedure  Abscess of earlobe, left     Lissa Hoard, PA-C 11/02/15 2229    Loleta Rose, MD 11/03/15 0002

## 2015-11-02 NOTE — Discharge Instructions (Signed)
Keep the wound clean, dry, and covered. See your provider in 3 days for packing removal. Take the antibiotic as directed. Take the pain medicine as needed.

## 2015-11-03 ENCOUNTER — Other Ambulatory Visit: Payer: Self-pay

## 2015-11-03 DIAGNOSIS — Z01812 Encounter for preprocedural laboratory examination: Secondary | ICD-10-CM

## 2015-11-04 ENCOUNTER — Telehealth: Payer: Self-pay

## 2015-11-04 ENCOUNTER — Encounter: Admission: RE | Admit: 2015-11-04 | Payer: BLUE CROSS/BLUE SHIELD | Source: Ambulatory Visit

## 2015-11-04 ENCOUNTER — Ambulatory Visit: Admission: RE | Admit: 2015-11-04 | Payer: BLUE CROSS/BLUE SHIELD | Source: Ambulatory Visit

## 2015-11-04 NOTE — Telephone Encounter (Signed)
Patient called to say that she was canceling her upper GI scan scheduled for 11/04/15. She said that she would call back to reschedule if she decided to have this done.

## 2015-12-20 ENCOUNTER — Telehealth: Payer: Self-pay

## 2015-12-20 NOTE — Telephone Encounter (Signed)
Patient is scheduled for a CT this Thursday. Need to see when her last menstrual period was.

## 2015-12-22 ENCOUNTER — Other Ambulatory Visit: Payer: Self-pay

## 2015-12-22 DIAGNOSIS — Z01818 Encounter for other preprocedural examination: Secondary | ICD-10-CM

## 2015-12-23 ENCOUNTER — Other Ambulatory Visit
Admission: RE | Admit: 2015-12-23 | Discharge: 2015-12-23 | Disposition: A | Discharge status: Home / Self Care | Payer: BLUE CROSS/BLUE SHIELD | Source: Ambulatory Visit | Attending: Physician Assistant | Admitting: Physician Assistant

## 2015-12-23 ENCOUNTER — Ambulatory Visit
Admission: RE | Admit: 2015-12-23 | Discharge: 2015-12-23 | Disposition: A | Discharge status: Home / Self Care | Payer: BLUE CROSS/BLUE SHIELD | Source: Ambulatory Visit | Attending: General Surgery | Admitting: General Surgery

## 2015-12-23 ENCOUNTER — Encounter
Admission: RE | Admit: 2015-12-23 | Discharge: 2015-12-23 | Disposition: A | Discharge status: Home / Self Care | Payer: BLUE CROSS/BLUE SHIELD | Source: Ambulatory Visit | Attending: General Surgery | Admitting: General Surgery

## 2015-12-23 DIAGNOSIS — Z3202 Encounter for pregnancy test, result negative: Secondary | ICD-10-CM | POA: Insufficient documentation

## 2015-12-23 DIAGNOSIS — R1011 Right upper quadrant pain: Secondary | ICD-10-CM | POA: Insufficient documentation

## 2015-12-23 LAB — PREGNANCY, URINE: Preg Test, Ur: NEGATIVE

## 2015-12-23 MED ORDER — TECHNETIUM TC 99M MEBROFENIN IV KIT
5.0000 | PACK | Freq: Once | INTRAVENOUS | Status: AC | PRN
  Administered 2015-12-23: 5.23 via INTRAVENOUS

## 2016-01-05 ENCOUNTER — Telehealth: Payer: Self-pay | Admitting: *Deleted

## 2016-01-05 NOTE — Telephone Encounter (Deleted)
Attempted to call patient to schedule a follow up appointment with Dr. Lemar LivingsByrnett to discuss HIDA scan results and options.

## 2016-01-06 NOTE — Telephone Encounter (Signed)
-----   Message from Earline MayotteJeffrey W Byrnett, MD sent at 01/05/2016  9:50 AM EDT ----- Notify gallbladder low normal function. Arrange f/u to discuss options. ----- Message ----- From: Interface, Rad Results In Sent: 12/23/2015   4:09 PM To: Earline MayotteJeffrey W Byrnett, MD

## 2016-01-06 NOTE — Telephone Encounter (Signed)
Patient notified of gallbladder function results as instructed, verbalized understanding. Follow up appointment was made.

## 2016-01-20 ENCOUNTER — Ambulatory Visit (INDEPENDENT_AMBULATORY_CARE_PROVIDER_SITE_OTHER): Payer: BLUE CROSS/BLUE SHIELD | Admitting: General Surgery

## 2016-01-20 VITALS — BP 96/60 | HR 80 | Resp 14 | Ht 66.0 in | Wt 111.0 lb

## 2016-01-20 DIAGNOSIS — K811 Chronic cholecystitis: Secondary | ICD-10-CM | POA: Diagnosis not present

## 2016-01-20 NOTE — Patient Instructions (Addendum)
The patient is aware to call back for any questions or concerns.  Laparoscopic Cholecystectomy Laparoscopic cholecystectomy is surgery to remove the gallbladder. The gallbladder is located in the upper right part of the abdomen, behind the liver. It is a storage sac for bile, which is produced in the liver. Bile aids in the digestion and absorption of fats. Cholecystectomy is often done for inflammation of the gallbladder (cholecystitis). This condition is usually caused by a buildup of gallstones (cholelithiasis) in the gallbladder. Gallstones can block the flow of bile, and that can result in inflammation and pain. In severe cases, emergency surgery may be required. If emergency surgery is not required, you will have time to prepare for the procedure. Laparoscopic surgery is an alternative to open surgery. Laparoscopic surgery has a shorter recovery time. Your common bile duct may also need to be examined during the procedure. If stones are found in the common bile duct, they may be removed. LET YOUR HEALTH CARE PROVIDER KNOW ABOUT:  Any allergies you have.  All medicines you are taking, including vitamins, herbs, eye drops, creams, and over-the-counter medicines.  Previous problems you or members of your family have had with the use of anesthetics.  Any blood disorders you have.  Previous surgeries you have had.  Any medical conditions you have. RISKS AND COMPLICATIONS Generally, this is a safe procedure. However, problems may occur, including:  Infection.  Bleeding.  Allergic reactions to medicines.  Damage to other structures or organs.  A stone remaining in the common bile duct.  A bile leak from the cyst duct that is clipped when your gallbladder is removed.  The need to convert to open surgery, which requires a larger incision in the abdomen. This may be necessary if your surgeon thinks that it is not safe to continue with a laparoscopic procedure. BEFORE THE  PROCEDURE  Ask your health care provider about:  Changing or stopping your regular medicines. This is especially important if you are taking diabetes medicines or blood thinners.  Taking medicines such as aspirin and ibuprofen. These medicines can thin your blood. Do not take these medicines before your procedure if your health care provider instructs you not to.  Follow instructions from your health care provider about eating or drinking restrictions.  Let your health care provider know if you develop a cold or an infection before surgery.  Plan to have someone take you home after the procedure.  Ask your health care provider how your surgical site will be marked or identified.  You may be given antibiotic medicine to help prevent infection. PROCEDURE  To reduce your risk of infection:  Your health care team will wash or sanitize their hands.  Your skin will be washed with soap.  An IV tube may be inserted into one of your veins.  You will be given a medicine to make you fall asleep (general anesthetic).  A breathing tube will be placed in your mouth.  The surgeon will make several small cuts (incisions) in your abdomen.  A thin, lighted tube (laparoscope) that has a tiny camera on the end will be inserted through one of the small incisions. The camera on the laparoscope will send a picture to a TV screen (monitor) in the operating room. This will give the surgeon a good view inside your abdomen.  A gas will be pumped into your abdomen. This will expand your abdomen to give the surgeon more room to perform the surgery.  Other tools that are needed   for the procedure will be inserted through the other incisions. The gallbladder will be removed through one of the incisions.  After your gallbladder has been removed, the incisions will be closed with stitches (sutures), staples, or skin glue.  Your incisions may be covered with a bandage (dressing). The procedure may vary among  health care providers and hospitals. AFTER THE PROCEDURE  Your blood pressure, heart rate, breathing rate, and blood oxygen level will be monitored often until the medicines you were given have worn off.  You will be given medicines as needed to control your pain.   This information is not intended to replace advice given to you by your health care provider. Make sure you discuss any questions you have with your health care provider.   Document Released: 03/27/2005 Document Revised: 12/16/2014 Document Reviewed: 11/06/2012 Elsevier Interactive Patient Education 2016 [employer]  

## 2016-01-20 NOTE — Progress Notes (Signed)
Patient ID: Leslie [name], female   DOB: 10/18/1985, 30 y.o.   MRN: [id number]  Chief Complaint  Patient presents with  . Other    review HIDA scan    HPI Leslie [name] is a 30 y.o. female.  Here today to review HIDA scan. She has been able to control the pain by monitoring her diet. She states she had pain with the second portion of the HIDA scan and she vomited when she got home. She did have an episode 2 weeks ago during the night, sweats, vomiting and pain. The pain was right upper abdomen to the back area. She states it lasted a couple of hours.  She is on occasion been awakened at night from sleep with pain. No associated reflux symptoms.  HPI  Past Medical History:  Diagnosis Date  . Back injury   . Bulging lumbar disc     Past Surgical History:  Procedure Laterality Date  . CESAREAN SECTION  2005  . CESAREAN SECTION  2016  . CESAREAN SECTION  2005, 2016  . TONSILLECTOMY AND ADENOIDECTOMY  1987  . TONSILLECTOMY AND ADENOIDECTOMY  1987    Family History  Problem Relation Age of Onset  . Diabetes Mother     Social History Social History  Substance Use Topics  . Smoking status: Current Every Day Smoker    Packs/day: 0.50    Years: 15.00    Types: Cigarettes  . Smokeless tobacco: Never Used  . Alcohol use 0.0 oz/week     Comment: wine daily    Allergies  Allergen Reactions  . Amoxicillin Hives  . Bactrim [Sulfamethoxazole-Trimethoprim] Hives  . Penicillins   . Penicillins Hives    Current Outpatient Prescriptions  Medication Sig Dispense Refill  . gabapentin (NEURONTIN) 300 MG capsule Take 300 mg by mouth 3 (three) times daily.    . pantoprazole (PROTONIX) 40 MG tablet Take 1 tablet (40 mg total) by mouth at bedtime. 30 tablet 6  . ranitidine (ZANTAC) 75 MG tablet Take 75 mg by mouth 2 (two) times daily.     No current facility-administered medications for this visit.     Review of Systems Review of Systems  Constitutional: Negative.    Respiratory: Negative.   Cardiovascular: Negative.     Blood pressure 96/60, pulse 80, resp. rate 14, height 5\' 6"  (1.676 m), weight 111 lb (50.3 kg).  Physical Exam Physical Exam  Constitutional: She is oriented to person, place, and time. She appears well-developed and well-nourished.  HENT:  Mouth/Throat: Oropharynx is clear and moist.  Eyes: Conjunctivae are normal. No scleral icterus.  Neck: Neck supple.  Cardiovascular: Normal rate, regular rhythm and normal heart sounds.   Pulmonary/Chest: Effort normal and breath sounds normal.  Lymphadenopathy:    She has no cervical adenopathy.  Neurological: She is alert and oriented to person, place, and time.  Skin: Skin is warm and dry.  Psychiatric: Her behavior is normal.    Data Reviewed HIDA scan dated 12/23/2015 showed an ejection fraction of 42%. The patient experienced nausea with the ensure used for gallbladder stimulation. After she returned home she reported vomiting in the onset of pain in the epigastrium, right upper quadrant with referral to the right back similar to what she is been experiencing.  Assessment    Symptoms suggestive of biliary colic.    Plan    I think the patient will likely benefit from elective cholecystectomy, although in light of present imaging studies no guarantee can be provided.  Laparoscopic Cholecystectomy with Intraoperative Cholangiogram. The procedure, including it's potential risks and complications (including but not limited to infection, bleeding, injury to intra-abdominal organs or bile ducts, bile leak, poor cosmetic result, sepsis and death) were discussed with the patient in detail. Non-operative options, including their inherent risks (acute calculous cholecystitis with possible choledocholithiasis or gallstone pancreatitis, with the risk of ascending cholangitis, sepsis, and death) were discussed as well. The patient expressed and understanding of what we discussed and wishes to  proceed with laparoscopic cholecystectomy. The patient further understands that if it is technically not possible, or it is unsafe to proceed laparoscopically, that I will convert to an open cholecystectomy.   Patient's surgery has been scheduled for 02-14-16 at [facility].  This information has been scribed by Dorathy Daft RN, BSN,BC.   Earline Mayotte 01/20/2016, 8:39 PM

## 2016-02-04 ENCOUNTER — Encounter
Admission: RE | Admit: 2016-02-04 | Discharge: 2016-02-04 | Disposition: A | Discharge status: Home / Self Care | Payer: BLUE CROSS/BLUE SHIELD | Source: Ambulatory Visit | Attending: General Surgery | Admitting: General Surgery

## 2016-02-04 HISTORY — DX: Nausea with vomiting, unspecified: R11.2

## 2016-02-04 HISTORY — DX: Headache, unspecified: R51.9

## 2016-02-04 HISTORY — DX: Family history of other specified conditions: Z84.89

## 2016-02-04 HISTORY — DX: Anxiety disorder, unspecified: F41.9

## 2016-02-04 HISTORY — DX: Other specified postprocedural states: Z98.890

## 2016-02-04 HISTORY — DX: Headache: R51

## 2016-02-04 HISTORY — DX: Adverse effect of unspecified anesthetic, initial encounter: T41.45XA

## 2016-02-04 HISTORY — DX: Other complications of anesthesia, initial encounter: T88.59XA

## 2016-02-04 HISTORY — DX: Gastro-esophageal reflux disease without esophagitis: K21.9

## 2016-02-04 NOTE — Patient Instructions (Signed)
  Your procedure is scheduled on: 02-14-16 Report to Same Day Surgery 2nd floor medical mall To find out your arrival time please call [phone] between 1PM - 3PM on 02-11-16  Remember: Instructions that are not followed completely may result in serious medical risk, up to and including death, or upon the discretion of your surgeon and anesthesiologist your surgery may need to be rescheduled.    _x___ 1. Do not eat food or drink liquids after midnight. No gum chewing or hard candies.     __x__ 2. No Alcohol for 24 hours before or after surgery.   __x__3. No Smoking for 24 prior to surgery.   ____  4. Bring all medications with you on the day of surgery if instructed.    __x__ 5. Notify your doctor if there is any change in your medical condition     (cold, fever, infections).     Do not wear jewelry, make-up, hairpins, clips or nail polish.  Do not wear lotions, powders, or perfumes. You may wear deodorant.  Do not shave 48 hours prior to surgery. Men may shave face and neck.  Do not bring valuables to the hospital.    [facility] is not responsible for any belongings or valuables.               Contacts, dentures or bridgework may not be worn into surgery.  Leave your suitcase in the car. After surgery it may be brought to your room.  For patients admitted to the hospital, discharge time is determined by your treatment team.   Patients discharged the day of surgery will not be allowed to drive home.    Please read over the following fact sheets that you were given:   [facility] Preparing for [facility] and or MRSA Information   _x___ Take these medicines the morning of surgery with A SIP OF WATER:    1. GABAPENTIN  2. ZANTAC  3.  4.  5.  6.  ____Fleets enema or Magnesium Citrate as directed.   ____ Use CHG Soap or sage wipes as directed on instruction sheet   ____ Use inhalers on the day of surgery and bring to hospital day of surgery  ____ Stop metformin 2 days prior  to surgery    ____ Take 1/2 of usual insulin dose the night before surgery and none on the morning of surgery.   ____ Stop aspirin or coumadin, or plavix  __ Stop Anti-inflammatories such as Advil, Aleve, Ibuprofen, Motrin, Naproxen,          Naprosyn, Goodies powders or aspirin products. OK TO CONTINUE ADVIL-DO NOT TAKE AM OF SURGERY   ____ Stop supplements until after surgery.    ____ Bring C-Pap to the hospital.

## 2016-02-11 ENCOUNTER — Encounter: Payer: Self-pay | Admitting: *Deleted

## 2016-02-14 ENCOUNTER — Ambulatory Visit: Payer: BLUE CROSS/BLUE SHIELD

## 2016-02-14 ENCOUNTER — Ambulatory Visit: Payer: BLUE CROSS/BLUE SHIELD | Admitting: Anesthesiology

## 2016-02-14 ENCOUNTER — Ambulatory Visit
Admission: RE | Admit: 2016-02-14 | Discharge: 2016-02-14 | Disposition: A | Discharge status: Home / Self Care | Payer: BLUE CROSS/BLUE SHIELD | Source: Ambulatory Visit | Attending: General Surgery | Admitting: General Surgery

## 2016-02-14 ENCOUNTER — Encounter: Payer: Self-pay | Admitting: *Deleted

## 2016-02-14 ENCOUNTER — Encounter
Admission: RE | Disposition: A | Discharge status: Home / Self Care | Payer: Self-pay | Source: Ambulatory Visit | Attending: General Surgery

## 2016-02-14 DIAGNOSIS — K811 Chronic cholecystitis: Secondary | ICD-10-CM | POA: Insufficient documentation

## 2016-02-14 DIAGNOSIS — Z833 Family history of diabetes mellitus: Secondary | ICD-10-CM | POA: Insufficient documentation

## 2016-02-14 DIAGNOSIS — Z880 Allergy status to penicillin: Secondary | ICD-10-CM | POA: Insufficient documentation

## 2016-02-14 DIAGNOSIS — K828 Other specified diseases of gallbladder: Secondary | ICD-10-CM | POA: Insufficient documentation

## 2016-02-14 DIAGNOSIS — Z882 Allergy status to sulfonamides status: Secondary | ICD-10-CM | POA: Insufficient documentation

## 2016-02-14 DIAGNOSIS — K8050 Calculus of bile duct without cholangitis or cholecystitis without obstruction: Secondary | ICD-10-CM

## 2016-02-14 DIAGNOSIS — M519 Unspecified thoracic, thoracolumbar and lumbosacral intervertebral disc disorder: Secondary | ICD-10-CM | POA: Insufficient documentation

## 2016-02-14 DIAGNOSIS — F17210 Nicotine dependence, cigarettes, uncomplicated: Secondary | ICD-10-CM | POA: Diagnosis not present

## 2016-02-14 DIAGNOSIS — Z881 Allergy status to other antibiotic agents status: Secondary | ICD-10-CM | POA: Insufficient documentation

## 2016-02-14 DIAGNOSIS — K219 Gastro-esophageal reflux disease without esophagitis: Secondary | ICD-10-CM | POA: Diagnosis not present

## 2016-02-14 DIAGNOSIS — Z9889 Other specified postprocedural states: Secondary | ICD-10-CM | POA: Insufficient documentation

## 2016-02-14 HISTORY — PX: CHOLECYSTECTOMY: SHX55

## 2016-02-14 LAB — URINE DRUG SCREEN, QUALITATIVE (ARMC ONLY)
AMPHETAMINES, UR SCREEN: NOT DETECTED
BENZODIAZEPINE, UR SCRN: NOT DETECTED
Barbiturates, Ur Screen: NOT DETECTED
CANNABINOID 50 NG, UR ~~LOC~~: POSITIVE — AB
Cocaine Metabolite,Ur ~~LOC~~: NOT DETECTED
MDMA (ECSTASY) UR SCREEN: NOT DETECTED
Methadone Scn, Ur: NOT DETECTED
OPIATE, UR SCREEN: NOT DETECTED
PHENCYCLIDINE (PCP) UR S: NOT DETECTED
Tricyclic, Ur Screen: NOT DETECTED

## 2016-02-14 LAB — POCT PREGNANCY, URINE: Preg Test, Ur: NEGATIVE

## 2016-02-14 SURGERY — LAPAROSCOPIC CHOLECYSTECTOMY WITH INTRAOPERATIVE CHOLANGIOGRAM
Anesthesia: General | Wound class: Clean Contaminated

## 2016-02-14 MED ORDER — TRAMADOL HCL 50 MG PO TABS
50.0000 mg | ORAL_TABLET | Freq: Once | ORAL | Status: AC
  Administered 2016-02-14: 50 mg via ORAL

## 2016-02-14 MED ORDER — FENTANYL CITRATE (PF) 100 MCG/2ML IJ SOLN
INTRAMUSCULAR | Status: AC
  Administered 2016-02-14: 25 ug via INTRAVENOUS
  Filled 2016-02-14: qty 2

## 2016-02-14 MED ORDER — KETOROLAC TROMETHAMINE 30 MG/ML IJ SOLN
INTRAMUSCULAR | Status: DC | PRN
  Administered 2016-02-14: 30 mg via INTRAVENOUS

## 2016-02-14 MED ORDER — FENTANYL CITRATE (PF) 100 MCG/2ML IJ SOLN
25.0000 ug | INTRAMUSCULAR | Status: AC | PRN
  Administered 2016-02-14 (×6): 25 ug via INTRAVENOUS

## 2016-02-14 MED ORDER — FENTANYL CITRATE (PF) 100 MCG/2ML IJ SOLN
INTRAMUSCULAR | Status: DC | PRN
  Administered 2016-02-14: 50 ug via INTRAVENOUS

## 2016-02-14 MED ORDER — TRAMADOL HCL 50 MG PO TABS
ORAL_TABLET | ORAL | Status: AC
  Filled 2016-02-14: qty 1

## 2016-02-14 MED ORDER — ONDANSETRON HCL 4 MG/2ML IJ SOLN
4.0000 mg | Freq: Once | INTRAMUSCULAR | Status: AC | PRN
Start: 2016-02-14 — End: 2016-02-14
  Administered 2016-02-14: 4 mg via INTRAVENOUS

## 2016-02-14 MED ORDER — PROMETHAZINE HCL 25 MG/ML IJ SOLN
INTRAMUSCULAR | Status: AC
  Filled 2016-02-14: qty 1

## 2016-02-14 MED ORDER — DIPHENHYDRAMINE HCL 12.5 MG/5ML PO SYRP: 12.5000 mg | ORAL_SOLUTION | ORAL | 0 refills | Status: DC | PRN

## 2016-02-14 MED ORDER — ONDANSETRON HCL 4 MG/2ML IJ SOLN
INTRAMUSCULAR | Status: AC
  Filled 2016-02-14: qty 2

## 2016-02-14 MED ORDER — ONDANSETRON HCL 4 MG/2ML IJ SOLN
INTRAMUSCULAR | Status: DC | PRN
  Administered 2016-02-14: 4 mg via INTRAVENOUS

## 2016-02-14 MED ORDER — PROPOFOL 10 MG/ML IV BOLUS
INTRAVENOUS | Status: DC | PRN
  Administered 2016-02-14: 120 mg via INTRAVENOUS

## 2016-02-14 MED ORDER — LACTATED RINGERS IV SOLN
INTRAVENOUS | Status: DC
  Administered 2016-02-14 (×2): via INTRAVENOUS

## 2016-02-14 MED ORDER — PROMETHAZINE HCL 25 MG/ML IJ SOLN
6.2500 mg | Freq: Once | INTRAMUSCULAR | Status: AC
  Administered 2016-02-14: 6.25 mg via INTRAVENOUS

## 2016-02-14 MED ORDER — LIDOCAINE HCL (CARDIAC) 20 MG/ML IV SOLN
INTRAVENOUS | Status: DC | PRN
  Administered 2016-02-14: 50 mg via INTRAVENOUS

## 2016-02-14 MED ORDER — IOTHALAMATE MEGLUMINE 60 % INJ SOLN
INTRAMUSCULAR | Status: DC | PRN
  Administered 2016-02-14: 13 mL

## 2016-02-14 MED ORDER — ACETAMINOPHEN 10 MG/ML IV SOLN
INTRAVENOUS | Status: AC
  Filled 2016-02-14: qty 100

## 2016-02-14 MED ORDER — SODIUM CHLORIDE 0.9 % IJ SOLN
INTRAMUSCULAR | Status: AC
  Filled 2016-02-14: qty 50

## 2016-02-14 MED ORDER — ROCURONIUM BROMIDE 100 MG/10ML IV SOLN
INTRAVENOUS | Status: DC | PRN
  Administered 2016-02-14: 30 mg via INTRAVENOUS

## 2016-02-14 MED ORDER — TRAMADOL HCL 50 MG PO TABS: 50.0000 mg | ORAL_TABLET | ORAL | 0 refills | Status: DC | PRN

## 2016-02-14 MED ORDER — GLYCOPYRROLATE 0.2 MG/ML IJ SOLN
INTRAMUSCULAR | Status: DC | PRN
  Administered 2016-02-14: 0.4 mg via INTRAVENOUS

## 2016-02-14 MED ORDER — NEOSTIGMINE METHYLSULFATE 10 MG/10ML IV SOLN
INTRAVENOUS | Status: DC | PRN
  Administered 2016-02-14: 2 mg via INTRAVENOUS

## 2016-02-14 MED ORDER — MIDAZOLAM HCL 2 MG/2ML IJ SOLN
INTRAMUSCULAR | Status: DC | PRN
  Administered 2016-02-14: 2 mg via INTRAVENOUS

## 2016-02-14 MED ORDER — SUCCINYLCHOLINE CHLORIDE 20 MG/ML IJ SOLN
INTRAMUSCULAR | Status: DC | PRN
  Administered 2016-02-14: 80 mg via INTRAVENOUS

## 2016-02-14 MED ORDER — ACETAMINOPHEN 10 MG/ML IV SOLN
INTRAVENOUS | Status: DC | PRN
  Administered 2016-02-14: 1000 mg via INTRAVENOUS

## 2016-02-14 SURGICAL SUPPLY — 42 items
APPLIER CLIP ROT 10 11.4 M/L (STAPLE) ×3
BLADE SURG 11 STRL SS SAFETY (MISCELLANEOUS) ×3 IMPLANT
CANISTER SUCT 1200ML W/VALVE (MISCELLANEOUS) ×3 IMPLANT
CANNULA DILATOR  5MM W/SLV (CANNULA) ×2
CANNULA DILATOR 10 W/SLV (CANNULA) ×2 IMPLANT
CANNULA DILATOR 10MM W/SLV (CANNULA) ×1
CANNULA DILATOR 5 W/SLV (CANNULA) ×4 IMPLANT
CATH CHOLANG 76X19 KUMAR (CATHETERS) ×3 IMPLANT
CHLORAPREP W/TINT 26ML (MISCELLANEOUS) ×3 IMPLANT
CLIP APPLIE ROT 10 11.4 M/L (STAPLE) ×1 IMPLANT
CLOSURE WOUND 1/2 X4 (GAUZE/BANDAGES/DRESSINGS) ×1
CONRAY 60ML FOR OR (MISCELLANEOUS) ×3 IMPLANT
COVER LIGHT HANDLE STERIS (MISCELLANEOUS) ×3 IMPLANT
DISSECTOR KITTNER STICK (MISCELLANEOUS) IMPLANT
DISSECTORS/KITTNER STICK (MISCELLANEOUS)
DRAPE SHEET LG 3/4 BI-LAMINATE (DRAPES) ×3 IMPLANT
DRESSING TELFA 4X3 1S ST N-ADH (GAUZE/BANDAGES/DRESSINGS) ×3 IMPLANT
DRSG TEGADERM 2-3/8X2-3/4 SM (GAUZE/BANDAGES/DRESSINGS) ×12 IMPLANT
ELECT REM PT RETURN 9FT ADLT (ELECTROSURGICAL) ×3
ELECTRODE REM PT RTRN 9FT ADLT (ELECTROSURGICAL) ×1 IMPLANT
ENDOPOUCH RETRIEVER 10 (MISCELLANEOUS) ×3 IMPLANT
GLOVE BIO SURGEON STRL SZ7.5 (GLOVE) ×9 IMPLANT
GLOVE INDICATOR 8.0 STRL GRN (GLOVE) ×9 IMPLANT
GOWN STRL REUS W/ TWL LRG LVL3 (GOWN DISPOSABLE) ×3 IMPLANT
GOWN STRL REUS W/TWL LRG LVL3 (GOWN DISPOSABLE) ×6
IRRIGATION STRYKERFLOW (MISCELLANEOUS) ×1 IMPLANT
IRRIGATOR STRYKERFLOW (MISCELLANEOUS) ×3
IV LACTATED RINGERS 1000ML (IV SOLUTION) ×3 IMPLANT
KIT RM TURNOVER STRD PROC AR (KITS) ×3 IMPLANT
LABEL OR SOLS (LABEL) ×3 IMPLANT
NDL INSUFF ACCESS 14 VERSASTEP (NEEDLE) ×3 IMPLANT
NS IRRIG 500ML POUR BTL (IV SOLUTION) ×3 IMPLANT
PACK LAP CHOLECYSTECTOMY (MISCELLANEOUS) ×3 IMPLANT
SCISSORS METZENBAUM CVD 33 (INSTRUMENTS) ×3 IMPLANT
SEAL FOR SCOPE WARMER C3101 (MISCELLANEOUS) IMPLANT
STRIP CLOSURE SKIN 1/2X4 (GAUZE/BANDAGES/DRESSINGS) ×2 IMPLANT
SUT VIC AB 0 CT2 27 (SUTURE) ×3 IMPLANT
SUT VIC AB 4-0 FS2 27 (SUTURE) ×6 IMPLANT
SWABSTK COMLB BENZOIN TINCTURE (MISCELLANEOUS) ×3 IMPLANT
TROCAR XCEL NON-BLD 11X100MML (ENDOMECHANICALS) ×3 IMPLANT
TUBING INSUFFLATOR HI FLOW (MISCELLANEOUS) ×3 IMPLANT
WATER STERILE IRR 1000ML POUR (IV SOLUTION) IMPLANT

## 2016-02-14 NOTE — Anesthesia Procedure Notes (Signed)
Procedure Name: Intubation Date/Time: 02/14/2016 8:34 AM Performed by: [id number], Schelly Chuba Pre-anesthesia Checklist: Patient identified, Emergency Drugs available, Timeout performed, Patient being monitored and Suction available Patient Re-evaluated:Patient Re-evaluated prior to inductionOxygen Delivery Method: Circle system utilized Preoxygenation: Pre-oxygenation with 100% oxygen Intubation Type: IV induction Laryngoscope Size: Mac and 3 Grade View: Grade I Tube type: Oral Tube size: 7.0 mm Number of attempts: 1 Airway Equipment and Method: Stylet Secured at: 21 cm Tube secured with: Tape

## 2016-02-14 NOTE — Anesthesia Preprocedure Evaluation (Signed)
Anesthesia Evaluation  Patient identified by MRN, date of birth, ID band Patient awake    Reviewed: Allergy & Precautions, H&P , NPO status , Patient's Chart, lab work & pertinent test results, reviewed documented beta blocker date and time   History of Anesthesia Complications (+) PONV, Family history of anesthesia reaction and history of anesthetic complications  Airway Mallampati: II  TM Distance: >3 FB Neck ROM: full    Dental  (+) Teeth Intact   Pulmonary neg pulmonary ROS, Current Smoker,    Pulmonary exam normal        Cardiovascular negative cardio ROS Normal cardiovascular exam Rhythm:regular Rate:Normal     Neuro/Psych  Headaches, negative neurological ROS  negative psych ROS   GI/Hepatic negative GI ROS, Neg liver ROS, GERD  ,  Endo/Other  negative endocrine ROS  Renal/GU negative Renal ROS  negative genitourinary   Musculoskeletal   Abdominal   Peds  Hematology negative hematology ROS (+)   Anesthesia Other Findings Past Medical History: No date: Anxiety No date: Back injury No date: Bulging lumbar disc No date: Complication of anesthesia No date: Family history of adverse reaction to anesthes*     Comment: N/V-Mom No date: GERD (gastroesophageal reflux disease) No date: Headache     Comment: H/O No date: PONV (postoperative nausea and vomiting) Past Surgical History: 2005: CESAREAN SECTION 2016: CESAREAN SECTION 2005, 2016: CESAREAN SECTION 1987: TONSILLECTOMY AND ADENOIDECTOMY 1987: TONSILLECTOMY AND ADENOIDECTOMY   Reproductive/Obstetrics negative OB ROS                             Anesthesia Physical Anesthesia Plan  ASA: II  Anesthesia Plan: General ETT   Post-op Pain Management:    Induction:   Airway Management Planned:   Additional Equipment:   Intra-op Plan:   Post-operative Plan:   Informed Consent: I have reviewed the patients History and  Physical, chart, labs and discussed the procedure including the risks, benefits and alternatives for the proposed anesthesia with the patient or authorized representative who has indicated his/her understanding and acceptance.   Dental Advisory Given  Plan Discussed with: CRNA  Anesthesia Plan Comments:         Anesthesia Quick Evaluation

## 2016-02-14 NOTE — Op Note (Signed)
Preoperative diagnosis: Chronic cholecystitis area  Postoperative diagnosis: Same.  Operative procedure: Laparoscopic cholecystectomy with intraoperative cholangiograms.  Operating surgeon: Lane HackerJeffery Duwayne Matters, M.D.  Assistant: Laneta SimmersJamie Benson, RN first assist.  Anesthesia: Gen. endotracheal.  Estimated blood loss: Less than 2 mL.  Clinical note: This 30 year old woman has had recurrent episodes of right upper quadrant pain. Ultrasound was negative. HIDA scan showed normal ejection fraction of 42% but did reproduce some of her symptoms. Her evaluation is otherwise been negative and she was felt be a candidate for elective cholecystectomy a stone her symptom complex.  Operative note: With the patient under adequate general endotracheal anesthesia the abdomen was prepped with ChloraPrep and draped. There was a small 3 mm fascial defect of the umbilicus. This was exposed with a trans umbilical incision and the varies needle inserted. After assuring intra-abdominal location with the hanging drop test the abdomen was insufflated with CO2 a 10 mmHg mercury. A 10 mm Step port was expanded and inspection showed no evidence of injury from initial port placement. The patient was placed in reverse Trendelenburg position and rolled to the left. An 11 mm XL port was placed in the epigastrium and 25 mm ports placed in the right lateral abdominal wall. The gallbladder showed a band of adhesions of the duodenum. This was taken down with cautery dissection. The neck of the gallbladder was cleared and the cystic duct exposed. Fluoroscopic cholangiograms were completed using 13 mL of one half strength Conray 60. This showed prompt filling of the right and left hepatic ducts and free flow to the duodenum. No stones noted. No ductal dilatation. The duct was doubly clipped and divided as well as cystic artery. The gallbladder was removed from the liver bed making use of hook cautery dissection. This was then delivered to the  umbilical port site. Inspection from the epigastric site showed no evidence of injury from initial port placement. The visualized colon surfaces were unremarkable. No evidence of significant adhesive disease from her previous C-section incisions. No inflammatory processes noted. The liver surface was entirely unremarkable. No inflammation around the duodenum. Of note, a oral gastric tube was placed by the nurse anesthetist at the beginning the procedure due to gastric distention which was removed at the end of the case.  The fascia at the umbilicus and in the epigastric incision was approximated with a single 0 Vicryl suture. Skin incisions were closed with 4-0 Vicryl subcuticular sutures. Benzoin, Steri-Strips, Telfa and Tegaderm dressings were applied.  The patient tolerated the procedure well and was taken recovery in stable condition.

## 2016-02-14 NOTE — Transfer of Care (Signed)
Immediate Anesthesia Transfer of Care Note  Patient: Leslie [name]  Procedure(s) Performed: Procedure(s): LAPAROSCOPIC CHOLECYSTECTOMY WITH INTRAOPERATIVE CHOLANGIOGRAM (N/A)  Patient Location: PACU  Anesthesia Type:General  Level of Consciousness: awake, alert  and oriented  Airway & Oxygen Therapy: Patient Spontanous Breathing  Post-op Assessment: Report given to RN  Post vital signs: Reviewed and stable  Last Vitals:  Vitals:   02/14/16 0738  BP: 116/77  Pulse: 71  Resp: 16  Temp: 36.8 C    Last Pain:  Vitals:   02/14/16 0738  TempSrc: Oral         Complications: No apparent anesthesia complications

## 2016-02-14 NOTE — Discharge Instructions (Signed)
AMBULATORY SURGERY  DISCHARGE INSTRUCTIONS   1) The drugs that you were given will stay in your system until tomorrow so for the next 24 hours you should not:  A) Drive an automobile B) Make any legal decisions C) Drink any alcoholic beverage   2) You may resume regular meals tomorrow.  Today it is better to start with liquids and gradually work up to solid foods.  You may eat anything you prefer, but it is better to start with liquids, then soup and crackers, and gradually work up to solid foods.   3) Please notify your doctor immediately if you have any unusual bleeding, trouble breathing, redness and pain at the surgery site, drainage, fever, or pain not relieved by medication.    4) Additional Instructions:        Please contact your physician with any problems or Same Day Surgery at [phone], Monday through Friday 6 am to 4 pm, or [place] at [facility] Main number at [phone].  Cholecystitis Cholecystitis is swelling and irritation (inflammation) of the gallbladder. The gallbladder is an organ that is shaped like a pear. It is under the liver on the right side of the body. This condition is often caused by gallstones. You doctor may do tests to see how your gallbladder works. These tests may include:  Imaging tests, such as:  An ultrasound.  MRI.  Tests that check how your liver works. This condition needs treatment. HOME CARE Home care will depend on your treatment. In general:  Take over-the-counter and prescription medicines only as told by your doctor.  If you were prescribed an antibiotic medicine, take it as told by your doctor. Do not stop taking the antibiotic even if you start to feel better.  Follow instructions from your doctor about what to eat or drink. When you are allowed to eat, avoid eating or drinking anything that causes your symptoms to start.  Keep all follow-up visits as told by your doctor. This is important. GET HELP IF:  You  have pain and your medicine does not help.  You have a fever. GET HELP RIGHT AWAY IF:  Your pain moves to:  Another part of your belly (abdomen).  Your back.  Your symptoms do not go away.  You have new symptoms.   This information is not intended to replace advice given to you by your health care provider. Make sure you discuss any questions you have with your health care provider.   Document Released: 03/16/2011 Document Revised: 12/16/2014 Document Reviewed: 07/08/2014 Elsevier Interactive Patient Education [employer]2016 Elsevier Inc.

## 2016-02-14 NOTE — Anesthesia Postprocedure Evaluation (Signed)
Anesthesia Post Note  Patient: Sherald HessLydia A Dosh  Procedure(s) Performed: Procedure(s) (LRB): LAPAROSCOPIC CHOLECYSTECTOMY WITH INTRAOPERATIVE CHOLANGIOGRAM (N/A)  Patient location during evaluation: PACU Anesthesia Type: General Level of consciousness: awake and alert Pain management: pain level controlled Vital Signs Assessment: post-procedure vital signs reviewed and stable Respiratory status: spontaneous breathing, nonlabored ventilation, respiratory function stable and patient connected to nasal cannula oxygen Cardiovascular status: blood pressure returned to baseline and stable Postop Assessment: no signs of nausea or vomiting Anesthetic complications: no    Last Vitals:  Vitals:   02/14/16 1015 02/14/16 1023  BP: (!) 97/54 (!) 105/58  Pulse: 62 60  Resp: 13 14  Temp:  36.6 C    Last Pain:  Vitals:   02/14/16 1023  TempSrc: Oral  PainSc: 7                  Yevette EdwardsJames G Adams

## 2016-02-14 NOTE — H&P (Signed)
Abdominal symptoms somewhat ameliorated with dietary modification. Some loose bowel movements over the weekends. No cardiopulmonary symptoms. For cholecystectomy.

## 2016-02-15 LAB — SURGICAL PATHOLOGY

## 2016-02-16 ENCOUNTER — Encounter: Payer: Self-pay | Admitting: General Surgery

## 2016-02-16 ENCOUNTER — Ambulatory Visit (INDEPENDENT_AMBULATORY_CARE_PROVIDER_SITE_OTHER): Payer: BLUE CROSS/BLUE SHIELD | Admitting: General Surgery

## 2016-02-16 VITALS — BP 128/80 | HR 78 | Temp 99.2°F | Ht 66.0 in | Wt 111.0 lb

## 2016-02-16 DIAGNOSIS — K811 Chronic cholecystitis: Secondary | ICD-10-CM

## 2016-02-16 DIAGNOSIS — Z9889 Other specified postprocedural states: Secondary | ICD-10-CM

## 2016-02-16 DIAGNOSIS — R112 Nausea with vomiting, unspecified: Secondary | ICD-10-CM

## 2016-02-16 NOTE — Progress Notes (Signed)
Patient ID: Leslie [name], female   DOB: 03/09/1986, 30 y.o.   MRN: [id number]  Chief Complaint  Patient presents with  . Routine Post Op    HPI Leslie ClanLydia A Colla is a 30 y.o. female here for post operative nausea and vomiting. She reports nausea and vomiting for the past 2 days, with today being the worst. Only able to drink water. She also having a headache all day. She has a history of headaches which she states she is given Percocet. She has only had her Tramadol. She does report some chills but no fever. She also states that her dressing below her belly button is saturated with blood.   Also noted almost fully saturated dressing on right lower abdomen. Dressings removed and surgical wound clean and intact. No further bleeding noted. Steri strips in place.  I have reviewed the history of present illness with the patient.           HPI  Past Medical History:  Diagnosis Date  . Anxiety   . Back injury   . Bulging lumbar disc   . Complication of anesthesia   . Family history of adverse reaction to anesthesia    N/V-Mom  . GERD (gastroesophageal reflux disease)   . Headache    H/O  . PONV (postoperative nausea and vomiting)     Past Surgical History:  Procedure Laterality Date  . CESAREAN SECTION  2005  . CESAREAN SECTION  2016  . CESAREAN SECTION  2005, 2016  . CHOLECYSTECTOMY N/A 02/14/2016   Procedure: LAPAROSCOPIC CHOLECYSTECTOMY WITH INTRAOPERATIVE CHOLANGIOGRAM;  Surgeon: Earline MayotteJeffrey W Byrnett, MD;  Location: [facility] ORS;  Service: General;  Laterality: N/A;  . TONSILLECTOMY AND ADENOIDECTOMY  1987  . TONSILLECTOMY AND ADENOIDECTOMY  1987    Family History  Problem Relation Age of Onset  . Diabetes Mother     Social History Social History  Substance Use Topics  . Smoking status: Current Every Day Smoker    Packs/day: 0.50    Years: 15.00    Types: Cigarettes  . Smokeless tobacco: Never Used  . Alcohol use 0.0 oz/week     Comment: wine WEEKENDS    Allergies  Allergen  Reactions  . Amoxicillin Hives    Has patient had a PCN reaction causing immediate rash, facial/tongue/throat swelling, SOB or lightheadedness with hypotension: {no Has patient had a PCN reaction causing severe rash involving mucus membranes or skin necrosis: no Has patient had a PCN reaction that required hospitalization no Has patient had a PCN reaction occurring within the last 10 years: no If all of the above answers are "NO", then may proceed with Cephalosporin use.  . Bactrim [Sulfamethoxazole-Trimethoprim] Hives  . Penicillins Hives    Has patient had a PCN reaction causing immediate rash, facial/tongue/throat swelling, SOB or lightheadedness with hypotension: {no Has patient had a PCN reaction causing severe rash involving mucus membranes or skin necrosis: {no Has patient had a PCN reaction that required hospitalization no Has patient had a PCN reaction occurring within the last 10 years: {no If all of the above answers are "NO", then may proceed with Cephalosporin use.  . Tape Rash    PT TOLERATES PAPER TAPE WELL    Current Outpatient Prescriptions  Medication Sig Dispense Refill  . gabapentin (NEURONTIN) 300 MG capsule Take 300 mg by mouth 3 (three) times daily.    . pantoprazole (PROTONIX) 40 MG tablet Take 1 tablet (40 mg total) by mouth at bedtime. 30 tablet 6  .  ranitidine (ZANTAC) 75 MG tablet Take 75 mg by mouth 2 (two) times daily.    . traMADol (ULTRAM) 50 MG tablet Take 1 tablet (50 mg total) by mouth every 4 (four) hours as needed for moderate pain (Use benadryl if needed for itching with this medication.). 20 tablet 0  . diphenhydrAMINE (BENYLIN) 12.5 MG/5ML syrup Take 5 mLs (12.5 mg total) by mouth every 4 (four) hours as needed for allergies (Use with Tramadol if needed for itching.). 120 mL 0  . ibuprofen (ADVIL,MOTRIN) 200 MG tablet Take 200 mg by mouth every 6 (six) hours as needed for moderate pain.     No current facility-administered medications for this visit.      Review of Systems Review of Systems  Constitutional: Positive for chills. Negative for activity change, appetite change, diaphoresis, fatigue, fever and unexpected weight change.  Respiratory: Negative.   Cardiovascular: Negative.     Blood pressure 128/80, pulse 78, temperature 99.2 F (37.3 C), height 5\' 6"  (1.676 m), weight 111 lb (50.3 kg), last menstrual period 01/21/2016.  Physical Exam Physical Exam  Constitutional: She appears well-developed and well-nourished.  Cardiovascular: Normal rate, regular rhythm and normal heart sounds.   Pulmonary/Chest: Effort normal and breath sounds normal.  Abdominal: Soft. Normal appearance and bowel sounds are normal. There is tenderness (Mild as expected post-op).  Port sites appear to be clean. No active bleeding noted.  Skin: Skin is warm and dry.  Psychiatric: She has a normal mood and affect.    Data Reviewed Op note  Assessment    Post-op nausea vomiting, likely resulting from headache. No apparent problems associated with the surgery    Plan    Rx given for Percocet 5-325 mg #10 for headache and Phenergan 12.5 mg #10 for nausea and vomiting Follow up as scheduled with Dr. Shara BlazingBrynett     This has been scribed by Sinda Duaryl-Lyn M Kennedy LPN   [facility],SEEPLAPUTHUR G 02/16/2016, 3:23 PM

## 2016-02-16 NOTE — Progress Notes (Signed)
Patient ID: Leslie ClanLydia A Byrum, female   DOB: 08/31/1985, 30 y.o.   MRN: [id number]

## 2016-02-23 ENCOUNTER — Ambulatory Visit (INDEPENDENT_AMBULATORY_CARE_PROVIDER_SITE_OTHER): Payer: BLUE CROSS/BLUE SHIELD | Admitting: General Surgery

## 2016-02-23 ENCOUNTER — Encounter: Payer: Self-pay | Admitting: General Surgery

## 2016-02-23 VITALS — BP 98/64 | HR 94 | Resp 12 | Ht 66.0 in | Wt 110.0 lb

## 2016-02-23 DIAGNOSIS — K811 Chronic cholecystitis: Secondary | ICD-10-CM

## 2016-02-23 HISTORY — DX: Chronic cholecystitis: K81.1

## 2016-02-23 NOTE — Patient Instructions (Addendum)
Return as needed. The patient is aware to call back for any questions or concerns. Proper lifting techniques reviewed. Resume activities as tolerated.

## 2016-02-23 NOTE — Progress Notes (Signed)
Patient ID: Leslie [name], female   DOB: 1985-09-30, 30 y.o.   MRN: [id number]  Chief Complaint  Patient presents with  . Routine Post Op    lap chole    HPI Leslie [name] is a 30 y.o. female here today for her post op gallbladder removal done on 11/6/20217. Patient states she is doing well. She started feeling better on Saturday, no further vomiting. She is here today with husband, Dorene Sorrow.  HPI  Past Medical History:  Diagnosis Date  . Anxiety   . Back injury   . Bulging lumbar disc   . Complication of anesthesia   . Family history of adverse reaction to anesthesia    N/V-Mom  . GERD (gastroesophageal reflux disease)   . Headache    H/O  . PONV (postoperative nausea and vomiting)     Past Surgical History:  Procedure Laterality Date  . CESAREAN SECTION  2005  . CESAREAN SECTION  2016  . CESAREAN SECTION  2005, 2016  . CHOLECYSTECTOMY N/A 02/14/2016   Procedure: LAPAROSCOPIC CHOLECYSTECTOMY WITH INTRAOPERATIVE CHOLANGIOGRAM;  Surgeon: Earline Mayotte, MD;  Location: [facility] ORS;  Service: General;  Laterality: N/A;  . TONSILLECTOMY AND ADENOIDECTOMY  1987  . TONSILLECTOMY AND ADENOIDECTOMY  1987    Family History  Problem Relation Age of Onset  . Diabetes Mother     Social History Social History  Substance Use Topics  . Smoking status: Current Every Day Smoker    Packs/day: 0.50    Years: 15.00    Types: Cigarettes  . Smokeless tobacco: Never Used  . Alcohol use 0.0 oz/week     Comment: wine WEEKENDS    Allergies  Allergen Reactions  . Amoxicillin Hives    Has patient had a PCN reaction causing immediate rash, facial/tongue/throat swelling, SOB or lightheadedness with hypotension: {no Has patient had a PCN reaction causing severe rash involving mucus membranes or skin necrosis: no Has patient had a PCN reaction that required hospitalization no Has patient had a PCN reaction occurring within the last 10 years: no If all of the above answers are "NO", then may  proceed with Cephalosporin use.  . Bactrim [Sulfamethoxazole-Trimethoprim] Hives  . Penicillins Hives    Has patient had a PCN reaction causing immediate rash, facial/tongue/throat swelling, SOB or lightheadedness with hypotension: {no Has patient had a PCN reaction causing severe rash involving mucus membranes or skin necrosis: {no Has patient had a PCN reaction that required hospitalization no Has patient had a PCN reaction occurring within the last 10 years: {no If all of the above answers are "NO", then may proceed with Cephalosporin use.  . Tape Rash    PT TOLERATES PAPER TAPE WELL    Current Outpatient Prescriptions  Medication Sig Dispense Refill  . diphenhydrAMINE (BENYLIN) 12.5 MG/5ML syrup Take 5 mLs (12.5 mg total) by mouth every 4 (four) hours as needed for allergies (Use with Tramadol if needed for itching.). 120 mL 0  . gabapentin (NEURONTIN) 300 MG capsule Take 300 mg by mouth 3 (three) times daily.    Marland Kitchen ibuprofen (ADVIL,MOTRIN) 200 MG tablet Take 200 mg by mouth every 6 (six) hours as needed for moderate pain.    . pantoprazole (PROTONIX) 40 MG tablet Take 1 tablet (40 mg total) by mouth at bedtime. 30 tablet 6  . promethazine (PHENERGAN) 25 MG tablet Take 25 mg by mouth every 6 (six) hours as needed for nausea or vomiting.    . ranitidine (ZANTAC) 75 MG tablet Take  75 mg by mouth 2 (two) times daily.     No current facility-administered medications for this visit.     Review of Systems Review of Systems  Constitutional: Negative.   Respiratory: Negative.   Cardiovascular: Negative.     Blood pressure 98/64, pulse 94, resp. rate 12, height 5\' 6"  (1.676 m), weight 110 lb (49.9 kg), last menstrual period 01/21/2016, SpO2 98 %.  Physical Exam Physical Exam  Constitutional: She is oriented to person, place, and time. She appears well-developed and well-nourished.  HENT:  Mouth/Throat: Oropharynx is clear and moist.  Eyes: Conjunctivae are normal. No scleral icterus.   Neck: Neck supple.  Cardiovascular: Normal rate, regular rhythm and normal heart sounds.   Pulmonary/Chest: Effort normal and breath sounds normal.  Abdominal: Soft. Normal appearance and bowel sounds are normal. There is no tenderness.  Port site are clean and healing well.   Lymphadenopathy:    She has no cervical adenopathy.  Neurological: She is alert and oriented to person, place, and time.  Skin: Skin is warm and dry.  Psychiatric: Her behavior is normal.    Data Reviewed Pathology showed chronic cholecystitis.  Assessment    Gen. improvement post cholecystectomy. Good pain relief.    Plan    He patient will decrease her previously prescribed Zantac. She has about a month's supply of Protonix. She'll take this daily for the next 3 weeks, then taper to every other day dose to complete her presence supply. She reported she has any recurrent symptoms.    Follow up as needed. Proper lifting techniques reviewed. Resume activities as tolerated.  This information has been scribed by Dorathy Daft RN, BSN,BC.    Earline Mayotte 02/23/2016, 11:49 PM

## 2017-05-15 DIAGNOSIS — M79642 Pain in left hand: Secondary | ICD-10-CM | POA: Diagnosis not present

## 2017-05-15 DIAGNOSIS — F17200 Nicotine dependence, unspecified, uncomplicated: Secondary | ICD-10-CM | POA: Diagnosis not present

## 2017-05-15 DIAGNOSIS — Z881 Allergy status to other antibiotic agents status: Secondary | ICD-10-CM | POA: Diagnosis not present

## 2017-05-15 DIAGNOSIS — S61422A Laceration with foreign body of left hand, initial encounter: Secondary | ICD-10-CM | POA: Diagnosis not present

## 2017-05-15 DIAGNOSIS — Z880 Allergy status to penicillin: Secondary | ICD-10-CM | POA: Diagnosis not present

## 2017-05-15 DIAGNOSIS — S61412A Laceration without foreign body of left hand, initial encounter: Secondary | ICD-10-CM | POA: Diagnosis not present

## 2017-06-25 ENCOUNTER — Ambulatory Visit: Payer: Self-pay | Admitting: Family Medicine

## 2017-08-01 ENCOUNTER — Ambulatory Visit: Payer: Self-pay | Admitting: Family Medicine

## 2017-11-13 ENCOUNTER — Ambulatory Visit: Payer: Self-pay | Admitting: Family Medicine

## 2018-02-16 DIAGNOSIS — R05 Cough: Secondary | ICD-10-CM | POA: Diagnosis not present

## 2018-02-16 DIAGNOSIS — R062 Wheezing: Secondary | ICD-10-CM | POA: Diagnosis not present

## 2018-02-16 DIAGNOSIS — R0602 Shortness of breath: Secondary | ICD-10-CM | POA: Diagnosis not present

## 2018-04-25 DIAGNOSIS — H6501 Acute serous otitis media, right ear: Secondary | ICD-10-CM | POA: Diagnosis not present

## 2018-09-09 ENCOUNTER — Ambulatory Visit: Payer: BLUE CROSS/BLUE SHIELD | Admitting: Family Medicine

## 2018-09-09 ENCOUNTER — Other Ambulatory Visit: Payer: Self-pay

## 2018-09-09 ENCOUNTER — Encounter: Payer: Self-pay | Admitting: Family Medicine

## 2018-09-09 VITALS — BP 126/78 | HR 93 | Temp 98.2°F | Resp 12 | Ht 66.0 in | Wt 123.5 lb

## 2018-09-09 DIAGNOSIS — Z114 Encounter for screening for human immunodeficiency virus [HIV]: Secondary | ICD-10-CM

## 2018-09-09 DIAGNOSIS — Z13220 Encounter for screening for lipoid disorders: Secondary | ICD-10-CM

## 2018-09-09 DIAGNOSIS — R5383 Other fatigue: Secondary | ICD-10-CM | POA: Diagnosis not present

## 2018-09-09 DIAGNOSIS — F321 Major depressive disorder, single episode, moderate: Secondary | ICD-10-CM

## 2018-09-09 DIAGNOSIS — Z716 Tobacco abuse counseling: Secondary | ICD-10-CM

## 2018-09-09 DIAGNOSIS — K219 Gastro-esophageal reflux disease without esophagitis: Secondary | ICD-10-CM | POA: Insufficient documentation

## 2018-09-09 DIAGNOSIS — Z1159 Encounter for screening for other viral diseases: Secondary | ICD-10-CM

## 2018-09-09 DIAGNOSIS — R634 Abnormal weight loss: Secondary | ICD-10-CM | POA: Diagnosis not present

## 2018-09-09 HISTORY — DX: Major depressive disorder, single episode, moderate: F32.1

## 2018-09-09 MED ORDER — BUPROPION HCL ER (XL) 150 MG PO TB24: 150.0000 mg | ORAL_TABLET | Freq: Every day | ORAL | 1 refills | Status: DC

## 2018-09-09 MED ORDER — OMEPRAZOLE 20 MG PO CPDR: 20.0000 mg | DELAYED_RELEASE_CAPSULE | Freq: Two times a day (BID) | ORAL | 1 refills | Status: DC

## 2018-09-09 NOTE — Progress Notes (Signed)
Name: Leslie [name]   MRN: [id number]    DOB: 04/06/1986   Date:09/09/2018       Progress Note  Subjective  Chief Complaint  Chief Complaint  Patient presents with  . New Patient (Initial Visit)  . Nicotine Dependence    wants to see about quit smoking  . Fatigue    onset over a year and hair loss, irritable, weight loss  . Gastroesophageal Reflux    since having gallbladder removed vomiting, diarrhea, sore throat acid ing stomach    HPI  Nicotine Dependence: Smoking about 1ppd. Wants to quit.  Has tried to cut down and then stop and this did not work.  Tried gum and it did not help. Tried Chantix and it made her very moody. She does struggle with anxiety and does not want to try Wellbutrin - no history SI/HI.   Fatigue/Depression Symptoms: She has been struggling with unintentional weight loss/inability to gain weight (drinking ensure), having brittle/falling out hair, irritability, +PHQ-9 score - all for about a year. Endorses diarrhea after eating often.    GERD: is s/p cholecystectomy.  She has nausea frequently because of the reflux.  Gets regurgitation, but no difficulty swallowing. Does get bloating - unclear if around her cycle as she has IUD.  Having diarrhea - has been taking immodium.   Patient Active Problem List   Diagnosis Date Noted  . Cholecystitis, chronic 02/23/2016    Past Surgical History:  Procedure Laterality Date  . BREAST SURGERY     augmentation  . CESAREAN SECTION  2005  . CESAREAN SECTION  2016  . CESAREAN SECTION  2005, 2016  . CHOLECYSTECTOMY N/A 02/14/2016   Procedure: LAPAROSCOPIC CHOLECYSTECTOMY WITH INTRAOPERATIVE CHOLANGIOGRAM;  Surgeon: Earline MayotteJeffrey W Byrnett, MD;  Location: [facility] ORS;  Service: General;  Laterality: N/A;  . TONSILLECTOMY AND ADENOIDECTOMY  1987  . TONSILLECTOMY AND ADENOIDECTOMY  1987    Family History  Problem Relation Age of Onset  . Diabetes Mother   . Thyroid disease Maternal Aunt   . Ovarian cancer Maternal  Grandmother   . Heart attack Maternal Grandmother   . Heart attack Maternal Grandfather   . Emphysema Paternal Grandmother     Social History   Socioeconomic History  . Marital status: Married    Spouse name: Windy Kalatajarry  . Number of children: 2  . Years of education: 4611  . Highest education level: 11th grade  Occupational History  . Not on file  Social Needs  . Financial resource strain: Not hard at all  . Food insecurity:    Worry: Never true    Inability: Never true  . Transportation needs:    Medical: No    Non-medical: No  Tobacco Use  . Smoking status: Current Every Day Smoker    Packs/day: 0.50    Years: 15.00    Pack years: 7.50    Types: Cigarettes  . Smokeless tobacco: Never Used  Substance and Sexual Activity  . Alcohol use: Yes    Alcohol/week: 0.0 standard drinks    Comment: wine WEEKENDS  . Drug use: No    Comment: pt denies during phone interview but UDS was + for marijuana in 2016  . Sexual activity: Yes  Lifestyle  . Physical activity:    Days per week: 0 days    Minutes per session: 0 min  . Stress: Not on file  Relationships  . Social connections:    Talks on phone: More than three times a week  Gets together: More than three times a week    Attends religious service: Never    Active member of club or organization: No    Attends meetings of clubs or organizations: Never    Relationship status: Married  . Intimate partner violence:    Fear of current or ex partner: No    Emotionally abused: No    Physically abused: No    Forced sexual activity: No  Other Topics Concern  . Not on file  Social History Narrative   ** Merged History Encounter **         Current Outpatient Medications:  .  diphenhydrAMINE (BENYLIN) 12.5 MG/5ML syrup, Take 5 mLs (12.5 mg total) by mouth every 4 (four) hours as needed for allergies (Use with Tramadol if needed for itching.). (Patient not taking: Reported on 09/09/2018), Disp: 120 mL, Rfl: 0 .  gabapentin  (NEURONTIN) 300 MG capsule, Take 300 mg by mouth 3 (three) times daily., Disp: , Rfl:  .  ibuprofen (ADVIL,MOTRIN) 200 MG tablet, Take 200 mg by mouth every 6 (six) hours as needed for moderate pain., Disp: , Rfl:  .  pantoprazole (PROTONIX) 40 MG tablet, Take 1 tablet (40 mg total) by mouth at bedtime. (Patient not taking: Reported on 09/09/2018), Disp: 30 tablet, Rfl: 6 .  promethazine (PHENERGAN) 25 MG tablet, Take 25 mg by mouth every 6 (six) hours as needed for nausea or vomiting., Disp: , Rfl:  .  ranitidine (ZANTAC) 75 MG tablet, Take 75 mg by mouth 2 (two) times daily., Disp: , Rfl:   Allergies  Allergen Reactions  . Amoxicillin Hives    Has patient had a PCN reaction causing immediate rash, facial/tongue/throat swelling, SOB or lightheadedness with hypotension: {no Has patient had a PCN reaction causing severe rash involving mucus membranes or skin necrosis: no Has patient had a PCN reaction that required hospitalization no Has patient had a PCN reaction occurring within the last 10 years: no If all of the above answers are "NO", then may proceed with Cephalosporin use.  . Bactrim [Sulfamethoxazole-Trimethoprim] Hives  . Penicillins Hives    Has patient had a PCN reaction causing immediate rash, facial/tongue/throat swelling, SOB or lightheadedness with hypotension: {no Has patient had a PCN reaction causing severe rash involving mucus membranes or skin necrosis: {no Has patient had a PCN reaction that required hospitalization no Has patient had a PCN reaction occurring within the last 10 years: {no If all of the above answers are "NO", then may proceed with Cephalosporin use.  . Tape Rash    PT TOLERATES PAPER TAPE WELL    I personally reviewed active problem list, medication list, allergies, health maintenance, notes from last encounter, lab results with the patient/caregiver today.  ROS  Ten systems reviewed and is negative except as mentioned in HPI  Objective  Vitals:    09/09/18 1340  BP: 126/78  Pulse: 93  Resp: 12  Temp: 98.2 F (36.8 C)  TempSrc: Oral  SpO2: 96%  Weight: 123 lb 8 oz (56 kg)  Height:  (1.676 m)   Body mass index is 19.93 kg/m.  Physical Exam  Constitutional: Patient appears well-developed and well-nourished. No distress.  HENT: Head: Normocephalic and atraumatic. Eyes: Conjunctivae and EOM are normal. No scleral icterus.  Neck: Normal range of motion. Neck supple. No JVD present. No thyromegaly present.  Cardiovascular: Normal rate, regular rhythm and normal heart sounds.  No murmur heard. No BLE edema. Pulmonary/Chest: Effort normal and breath sounds normal. No  respiratory distress. Abdominal: Soft. Bowel sounds are normal, no distension. There is no tenderness. No masses. Musculoskeletal: Normal range of motion, no joint effusions. No gross deformities Neurological: Pt is alert and oriented to person, place, and time. No cranial nerve deficit. Coordination, balance, strength, speech and gait are normal.  Skin: Skin is warm and dry. No rash noted. No erythema.  Psychiatric: Patient has a normal mood and affect. behavior is normal. Judgment and thought content normal.  No results found for this or any previous visit (from the past 72 hour(s)).  PHQ2/9: Depression screen PHQ 2/9 09/09/2018  Decreased Interest 3  Down, Depressed, Hopeless 0  PHQ - 2 Score 3  Altered sleeping 3  Tired, decreased energy 3  Change in appetite 3  Feeling bad or failure about yourself  1  Trouble concentrating 2  Moving slowly or fidgety/restless 1  Suicidal thoughts 0  PHQ-9 Score 16  Difficult doing work/chores Somewhat difficult   PHQ-2/9 Result is positive.    Fall Risk: Fall Risk  09/09/2018  Falls in the past year? 0  Number falls in past yr: 0  Injury with Fall? 0   Functional Status Survey: Is the patient deaf or have difficulty hearing?: No Does the patient have difficulty seeing, even when wearing glasses/contacts?: Yes  Does the patient have difficulty concentrating, remembering, or making decisions?: No Does the patient have difficulty walking or climbing stairs?: No Does the patient have difficulty dressing or bathing?: No Does the patient have difficulty doing errands alone such as visiting a doctor's office or shopping?: No  Assessment & Plan  1. Encounter for tobacco use cessation counseling - She is aware that wellbutrin may increase anxiety and weight loss - if these side effects occur, she will call and hold her medication. - buPROPion (WELLBUTRIN XL) 150 MG 24 hr tablet; Take 1 tablet (150 mg total) by mouth daily.  Dispense: 90 tablet; Refill: 1  2. Fatigue, unspecified type - COMPLETE METABOLIC PANEL WITH GFR - CBC with Differential/Platelet - Thyroid Panel With TSH  3. Current moderate episode of major depressive disorder without prior episode (HCC) - Wellbutrin per orders. - She is aware that wellbutrin may increase anxiety and weight loss - if these side effects occur, she will call and hold her medication.  4. Lipid screening - Lipid panel  5. Encounter for screening for HIV - HIV Antibody (routine testing w rflx)  6. Need for hepatitis C screening test - Hepatitis C antibody

## 2018-09-10 ENCOUNTER — Ambulatory Visit: Payer: Self-pay | Admitting: Family Medicine

## 2018-09-10 LAB — COMPLETE METABOLIC PANEL WITHOUT GFR
AG Ratio: 2 (calc) (ref 1.0–2.5)
ALT: 17 U/L (ref 6–29)
AST: 19 U/L (ref 10–30)
Albumin: 4.7 g/dL (ref 3.6–5.1)
Alkaline phosphatase (APISO): 50 U/L (ref 31–125)
BUN: 11 mg/dL (ref 7–25)
CO2: 23 mmol/L (ref 20–32)
Calcium: 10 mg/dL (ref 8.6–10.2)
Chloride: 104 mmol/L (ref 98–110)
Creat: 0.7 mg/dL (ref 0.50–1.10)
GFR, Est African American: 131 mL/min/1.73m2
GFR, Est Non African American: 113 mL/min/1.73m2
Globulin: 2.4 g/dL (ref 1.9–3.7)
Glucose, Bld: 92 mg/dL (ref 65–99)
Potassium: 4.2 mmol/L (ref 3.5–5.3)
Sodium: 138 mmol/L (ref 135–146)
Total Bilirubin: 0.5 mg/dL (ref 0.2–1.2)
Total Protein: 7.1 g/dL (ref 6.1–8.1)

## 2018-09-10 LAB — THYROID PANEL WITH TSH
Free Thyroxine Index: 1.9 (ref 1.4–3.8)
T3 Uptake: 36 % — ABNORMAL HIGH (ref 22–35)
T4, Total: 5.4 ug/dL (ref 5.1–11.9)
TSH: 2.33 m[IU]/L

## 2018-09-10 LAB — CBC WITH DIFFERENTIAL/PLATELET
Absolute Monocytes: 429 cells/uL (ref 200–950)
Basophils Absolute: 39 cells/uL (ref 0–200)
Basophils Relative: 0.7 %
Eosinophils Absolute: 28 cells/uL (ref 15–500)
Eosinophils Relative: 0.5 %
HCT: 36.2 % (ref 35.0–45.0)
Hemoglobin: 12.3 g/dL (ref 11.7–15.5)
Lymphs Abs: 1749 cells/uL (ref 850–3900)
MCH: 30.7 pg (ref 27.0–33.0)
MCHC: 34 g/dL (ref 32.0–36.0)
MCV: 90.3 fL (ref 80.0–100.0)
MPV: 11.9 fL (ref 7.5–12.5)
Monocytes Relative: 7.8 %
Neutro Abs: 3256 cells/uL (ref 1500–7800)
Neutrophils Relative %: 59.2 %
Platelets: 223 10*3/uL (ref 140–400)
RBC: 4.01 10*6/uL (ref 3.80–5.10)
RDW: 12.1 % (ref 11.0–15.0)
Total Lymphocyte: 31.8 %
WBC: 5.5 10*3/uL (ref 3.8–10.8)

## 2018-09-10 LAB — LIPID PANEL
Cholesterol: 189 mg/dL
HDL: 67 mg/dL
LDL Cholesterol (Calc): 106 mg/dL — ABNORMAL HIGH
Non-HDL Cholesterol (Calc): 122 mg/dL
Total CHOL/HDL Ratio: 2.8 (calc)
Triglycerides: 73 mg/dL

## 2018-09-10 LAB — HEPATITIS C ANTIBODY
Hepatitis C Ab: NONREACTIVE
SIGNAL TO CUT-OFF: 0.01 (ref <1.00)

## 2018-09-10 LAB — HIV ANTIBODY (ROUTINE TESTING W REFLEX): HIV 1&2 Ab, 4th Generation: NONREACTIVE

## 2018-09-11 ENCOUNTER — Encounter: Payer: Self-pay | Admitting: Family Medicine

## 2018-09-11 ENCOUNTER — Other Ambulatory Visit: Payer: Self-pay | Admitting: Family Medicine

## 2018-09-11 DIAGNOSIS — R5383 Other fatigue: Secondary | ICD-10-CM

## 2018-09-11 DIAGNOSIS — L659 Nonscarring hair loss, unspecified: Secondary | ICD-10-CM

## 2018-09-11 DIAGNOSIS — R634 Abnormal weight loss: Secondary | ICD-10-CM

## 2018-09-20 ENCOUNTER — Encounter: Payer: Self-pay | Admitting: Family Medicine

## 2018-10-01 ENCOUNTER — Other Ambulatory Visit: Payer: Self-pay | Admitting: Family Medicine

## 2018-10-01 DIAGNOSIS — K219 Gastro-esophageal reflux disease without esophagitis: Secondary | ICD-10-CM

## 2018-10-10 ENCOUNTER — Encounter: Payer: Self-pay | Admitting: Family Medicine

## 2018-10-10 ENCOUNTER — Other Ambulatory Visit: Payer: Self-pay

## 2018-10-10 ENCOUNTER — Ambulatory Visit (INDEPENDENT_AMBULATORY_CARE_PROVIDER_SITE_OTHER): Payer: BC Managed Care – PPO | Admitting: Family Medicine

## 2018-10-10 ENCOUNTER — Other Ambulatory Visit (HOSPITAL_COMMUNITY)
Admission: RE | Admit: 2018-10-10 | Discharge: 2018-10-10 | Disposition: A | Discharge status: Home / Self Care | Payer: BC Managed Care – PPO | Source: Ambulatory Visit | Attending: Family Medicine | Admitting: Family Medicine

## 2018-10-10 VITALS — BP 122/70 | HR 100 | Temp 97.5°F | Resp 14 | Ht 66.0 in | Wt 127.9 lb

## 2018-10-10 DIAGNOSIS — Z716 Tobacco abuse counseling: Secondary | ICD-10-CM | POA: Diagnosis not present

## 2018-10-10 DIAGNOSIS — Z01419 Encounter for gynecological examination (general) (routine) without abnormal findings: Secondary | ICD-10-CM | POA: Diagnosis not present

## 2018-10-10 DIAGNOSIS — R634 Abnormal weight loss: Secondary | ICD-10-CM

## 2018-10-10 DIAGNOSIS — F321 Major depressive disorder, single episode, moderate: Secondary | ICD-10-CM

## 2018-10-10 DIAGNOSIS — R5383 Other fatigue: Secondary | ICD-10-CM | POA: Diagnosis not present

## 2018-10-10 DIAGNOSIS — K219 Gastro-esophageal reflux disease without esophagitis: Secondary | ICD-10-CM

## 2018-10-10 MED ORDER — OMEPRAZOLE 20 MG PO CPDR: 20.0000 mg | DELAYED_RELEASE_CAPSULE | Freq: Two times a day (BID) | ORAL | 1 refills | Status: DC

## 2018-10-10 MED ORDER — BUPROPION HCL ER (XL) 300 MG PO TB24: 300.0000 mg | ORAL_TABLET | Freq: Every day | ORAL | 1 refills | Status: DC

## 2018-10-10 NOTE — Progress Notes (Signed)
Name: Leslie [name]   MRN: [id number]    DOB: May 25, 1985   Date:10/10/2018       Progress Note  Subjective  Chief Complaint  Chief Complaint  Patient presents with  . Annual Exam    pap    HPI  Patient presents for annual CPE and Follow Up.  Nicotine Dependence: Was smoking about 1ppd - started Wellbutrin at last visit and is down to 3 cigarettes a day.  Has tried to cut down and then stop and this did not work.  Tried gum and it did not help. Tried Chantix and it made her very moody. Some increase in anxiety, but is tolerating Wellbutrin without issues at this time.   Fatigue/Depression Symptoms: She has been struggling with unintentional weight loss/inability to gain weight (drinking ensure), having brittle/falling out hair, irritability, PHQ-9 score -has come down quite a bit - from 16 to 0. Diarrhea has improved.  She would like to continue the Wellbutrin. No SI/HI. No history of seizures.   GERD: is s/p cholecystectomy.  She has nausea frequently because of the reflux.  Gets regurgitation, but no difficulty swallowing. Does get bloating - unclear if around her cycle as she has IUD.  Taking omeprazole and this has helped her symptoms significantly - diarrhea has been significantly better; bloating has improved.   Diet: She has been eating balanced diet. Exercise: Not exercising, does have a 35 year old at home that keeps her busy   USPSTF grade A and B recommendations    Office Visit from 10/10/2018 in [facility]  AUDIT-C Score  1     Depression: Phq 9 is  negative Depression screen [facility] 2/9 10/10/2018 09/09/2018  Decreased Interest 0 3  Down, Depressed, Hopeless 0 0  PHQ - 2 Score 0 3  Altered sleeping 0 3  Tired, decreased energy 0 3  Change in appetite 0 3  Feeling bad or failure about yourself  0 1  Trouble concentrating 0 2  Moving slowly or fidgety/restless 0 1  Suicidal thoughts 0 0  PHQ-9 Score 0 16  Difficult doing work/chores Not difficult at  all Somewhat difficult   Hypertension: BP Readings from Last 3 Encounters:  10/10/18 122/70  09/09/18 126/78  02/23/16 98/64   Obesity: Wt Readings from Last 3 Encounters:  10/10/18 127 lb 14.4 oz (58 kg)  09/09/18 123 lb 8 oz (56 kg)  02/23/16 110 lb (49.9 kg)   BMI Readings from Last 3 Encounters:  10/10/18 20.64 kg/m  09/09/18 19.93 kg/m  02/23/16 17.75 kg/m    Hep C Screening: Negative in June 2020 STD testing and prevention (HIV/chl/gon/syphilis): Negative in June 2020; declines additional testing.  Intimate partner violence: None  Sexual History/Pain during Intercourse: Does have occasional pain with intercourse.  Menstrual History/LMP/Abnormal Bleeding: Has IUD in place; no vaginal bleeding.  Incontinence Symptoms: No concerns  Advanced Care Planning: A voluntary discussion about advance care planning including the explanation and discussion of advance directives.  Discussed health care proxy and Living will, and the patient was able to identify a health care proxy as Husband - Aigner Horseman.  Patient does not have a living will at present time. If patient does have living will, I have requested they bring this to the clinic to be scanned in to their chart.  Breast cancer: No concerning areas.  She does have breast implants - silicone (placed in 2703) no issues with them No results found for: [facility]  BRCA gene screening: No  family history Cervical cancer screening: Pap due today  Osteoporosis Screening: No family history No results found for: HMDEXASCAN  Lipids:  Lab Results  Component Value Date   CHOL 189 09/09/2018   Lab Results  Component Value Date   HDL 67 09/09/2018   Lab Results  Component Value Date   LDLCALC 106 (H) 09/09/2018   Lab Results  Component Value Date   TRIG 73 09/09/2018   Lab Results  Component Value Date   CHOLHDL 2.8 09/09/2018   No results found for: LDLDIRECT  Glucose:  Glucose  Date Value Ref Range Status  01/09/2014  81 65 - 99 mg/dL Final  05/07/2013 93 65 - 99 mg/dL Final  04/11/2013 74 65 - 99 mg/dL Final   Glucose, Bld  Date Value Ref Range Status  09/09/2018 92 65 - 99 mg/dL Final    Comment:    .            Fasting reference interval .     Skin cancer: HAs one lesion on the LEFT upper back that she noticed last year and has not changed in any way since then - no changes in color, border, or texture.  Colorectal cancer: Denies family or personal history of colorectal cancer, no changes in BM's - no blood in stool, dark and tarry stool, mucus in stool.  Has ongoing diarrhea as above, no constipation.    Lung cancer:  Working on quitting - see above. ECG: Denies chest pain, shortness of breath, or palpitations.   Patient Active Problem List   Diagnosis Date Noted  . Encounter for tobacco use cessation counseling 09/09/2018  . Fatigue 09/09/2018  . Current moderate episode of major depressive disorder without prior episode ([place]) 09/09/2018  . Gastroesophageal reflux disease without esophagitis 09/09/2018  . Cholecystitis, chronic 02/23/2016    Past Surgical History:  Procedure Laterality Date  . BREAST SURGERY     augmentation  . CESAREAN SECTION  2005  . CESAREAN SECTION  2016  . CESAREAN SECTION  2005, 2016  . CHOLECYSTECTOMY N/A 02/14/2016   Procedure: LAPAROSCOPIC CHOLECYSTECTOMY WITH INTRAOPERATIVE CHOLANGIOGRAM;  Surgeon: Robert Bellow, MD;  Location: [facility] ORS;  Service: General;  Laterality: N/A;  . TONSILLECTOMY AND ADENOIDECTOMY  1987  . TONSILLECTOMY AND ADENOIDECTOMY  1987    Family History  Problem Relation Age of Onset  . Diabetes Mother   . Thyroid disease Maternal Aunt   . Ovarian cancer Maternal Grandmother   . Heart attack Maternal Grandmother   . Heart attack Maternal Grandfather   . Emphysema Paternal Grandmother     Social History   Socioeconomic History  . Marital status: Married    Spouse name: Metro Kung  . Number of children: 2  . Years of  education: 83  . Highest education level: 11th grade  Occupational History  . Not on file  Social Needs  . Financial resource strain: Not hard at all  . Food insecurity    Worry: Never true    Inability: Never true  . Transportation needs    Medical: No    Non-medical: No  Tobacco Use  . Smoking status: Current Every Day Smoker    Packs/day: 0.50    Years: 15.00    Pack years: 7.50    Types: Cigarettes  . Smokeless tobacco: Never Used  Substance and Sexual Activity  . Alcohol use: Yes    Alcohol/week: 0.0 standard drinks    Comment: wine WEEKENDS  . Drug use: No  Comment: pt denies during phone interview but UDS was + for marijuana in 2016  . Sexual activity: Yes  Lifestyle  . Physical activity    Days per week: 0 days    Minutes per session: 0 min  . Stress: Not on file  Relationships  . Social connections    Talks on phone: More than three times a week    Gets together: More than three times a week    Attends religious service: Never    Active member of club or organization: No    Attends meetings of clubs or organizations: Never    Relationship status: Married  . Intimate partner violence    Fear of current or ex partner: No    Emotionally abused: No    Physically abused: No    Forced sexual activity: No  Other Topics Concern  . Not on file  Social History Narrative   ** Merged History Encounter **         Current Outpatient Medications:  .  buPROPion (WELLBUTRIN XL) 150 MG 24 hr tablet, Take 1 tablet (150 mg total) by mouth daily., Disp: 90 tablet, Rfl: 1 .  ibuprofen (ADVIL,MOTRIN) 200 MG tablet, Take 200 mg by mouth every 6 (six) hours as needed for moderate pain., Disp: , Rfl:  .  omeprazole (PRILOSEC) 20 MG capsule, Take 1 capsule (20 mg total) by mouth 2 (two) times daily before a meal., Disp: 60 capsule, Rfl: 1  Allergies  Allergen Reactions  . Amoxicillin Hives    Has patient had a PCN reaction causing immediate rash, facial/tongue/throat  swelling, SOB or lightheadedness with hypotension: {no Has patient had a PCN reaction causing severe rash involving mucus membranes or skin necrosis: no Has patient had a PCN reaction that required hospitalization no Has patient had a PCN reaction occurring within the last 10 years: no If all of the above answers are "NO", then may proceed with Cephalosporin use.  . Bactrim [Sulfamethoxazole-Trimethoprim] Hives  . Penicillins Hives    Has patient had a PCN reaction causing immediate rash, facial/tongue/throat swelling, SOB or lightheadedness with hypotension: {no Has patient had a PCN reaction causing severe rash involving mucus membranes or skin necrosis: {no Has patient had a PCN reaction that required hospitalization no Has patient had a PCN reaction occurring within the last 10 years: {no If all of the above answers are "NO", then may proceed with Cephalosporin use.  . Tape Rash    PT TOLERATES PAPER TAPE WELL     ROS  Constitutional: Negative for fever or weight change.  Respiratory: Negative for cough and shortness of breath.   Cardiovascular: Negative for chest pain or palpitations.  Gastrointestinal: Negative for abdominal pain, no bowel changes.  Musculoskeletal: Negative for gait problem or joint swelling.  Skin: Negative for rash.  Neurological: Negative for dizziness or headache.  No other specific complaints in a complete review of systems (except as listed in HPI above)  Objective  Vitals:   10/10/18 1335  BP: 122/70  Pulse: 100  Resp: 14  Temp: (!) 97.5 F (36.4 C)  TempSrc: Oral  SpO2: 99%  Weight: 127 lb 14.4 oz (58 kg)  Height: _0  (1.676 m)    Body mass index is 20.64 kg/m.  Physical Exam  Constitutional: Patient appears well-developed and well-nourished. No distress.  HENT: Head: Normocephalic and atraumatic. Ears: B TMs ok, no erythema or effusion; Nose: Nose normal. Mouth/Throat: Oropharynx is clear and moist. No oropharyngeal exudate.  Eyes:  Conjunctivae and EOM are normal. Pupils are equal, round, and reactive to light. No scleral icterus.  Neck: Normal range of motion. Neck supple. No JVD present. No thyromegaly present.  Cardiovascular: Normal rate, regular rhythm and normal heart sounds.  No murmur heard. No BLE edema. Pulmonary/Chest: Effort normal and breath sounds normal. No respiratory distress. Abdominal: Soft. Bowel sounds are normal, no distension. There is no tenderness. no masses Breast: no lumps or masses, no nipple discharge or rashes FEMALE GENITALIA:  External genitalia normal External urethra normal Vaginal vault normal without discharge or lesions Cervix normal without discharge or lesions Bimanual exam normal without masses RECTAL: no rectal masses or hemorrhoids Musculoskeletal: Normal range of motion, no joint effusions. No gross deformities Neurological: he is alert and oriented to person, place, and time. No cranial nerve deficit. Coordination, balance, strength, speech and gait are normal.  Skin: Skin is warm and dry. No rash noted. No erythema.  Psychiatric: Patient has a normal mood and affect. behavior is normal. Judgment and thought content normal.   Recent Results (from the past 2160 hour(s))  COMPLETE METABOLIC PANEL WITH GFR     Status: None   Collection Time: 09/09/18  2:32 PM  Result Value Ref Range   Glucose, Bld 92 65 - 99 mg/dL    Comment: .            Fasting reference interval .    BUN 11 7 - 25 mg/dL   Creat 0.70 0.50 - 1.10 mg/dL   GFR, Est Non African American 113 > OR = 60 mL/min/1.68m   GFR, Est African American 131 > OR = 60 mL/min/1.735m  BUN/Creatinine Ratio NOT APPLICABLE 6 - 22 (calc)   Sodium 138 135 - 146 mmol/L   Potassium 4.2 3.5 - 5.3 mmol/L   Chloride 104 98 - 110 mmol/L   CO2 23 20 - 32 mmol/L   Calcium 10.0 8.6 - 10.2 mg/dL   Total Protein 7.1 6.1 - 8.1 g/dL   Albumin 4.7 3.6 - 5.1 g/dL   Globulin 2.4 1.9 - 3.7 g/dL (calc)   AG Ratio 2.0 1.0 - 2.5 (calc)    Total Bilirubin 0.5 0.2 - 1.2 mg/dL   Alkaline phosphatase (APISO) 50 31 - 125 U/L   AST 19 10 - 30 U/L   ALT 17 6 - 29 U/L  CBC with Differential/Platelet     Status: None   Collection Time: 09/09/18  2:32 PM  Result Value Ref Range   WBC 5.5 3.8 - 10.8 Thousand/uL   RBC 4.01 3.80 - 5.10 Million/uL   Hemoglobin 12.3 11.7 - 15.5 g/dL   HCT 36.2 35.0 - 45.0 %   MCV 90.3 80.0 - 100.0 fL   MCH 30.7 27.0 - 33.0 pg   MCHC 34.0 32.0 - 36.0 g/dL   RDW 12.1 11.0 - 15.0 %   Platelets 223 140 - 400 Thousand/uL   MPV 11.9 7.5 - 12.5 fL   Neutro Abs 3,256 1,500 - 7,800 cells/uL   Lymphs Abs 1,749 850 - 3,900 cells/uL   Absolute Monocytes 429 200 - 950 cells/uL   Eosinophils Absolute 28 15 - 500 cells/uL   Basophils Absolute 39 0 - 200 cells/uL   Neutrophils Relative % 59.2 %   Total Lymphocyte 31.8 %   Monocytes Relative 7.8 %   Eosinophils Relative 0.5 %   Basophils Relative 0.7 %  Lipid panel     Status: Abnormal   Collection Time: 09/09/18  2:32 PM  Result Value Ref Range   Cholesterol 189 <200 mg/dL   HDL 67 > OR = 50 mg/dL   Triglycerides 73 <150 mg/dL   LDL Cholesterol (Calc) 106 (H) mg/dL (calc)    Comment: Reference range: <100 . Desirable range <100 mg/dL for primary prevention;   <70 mg/dL for patients with CHD or diabetic patients  with > or = 2 CHD risk factors. Marland Kitchen LDL-C is now calculated using the Martin-Hopkins  calculation, which is a validated novel method providing  better accuracy than the Friedewald equation in the  estimation of LDL-C.  Cresenciano Genre et al. Annamaria Helling. 5784;696(29): 2061-2068  (http://education.[facility].com/faq/FAQ164)    Total CHOL/HDL Ratio 2.8 <5.0 (calc)   Non-HDL Cholesterol (Calc) 122 <130 mg/dL (calc)    Comment: For patients with diabetes plus 1 major ASCVD risk  factor, treating to a non-HDL-C goal of <100 mg/dL  (LDL-C of <70 mg/dL) is considered a therapeutic  option.   Thyroid Panel With TSH     Status: Abnormal   Collection  Time: 09/09/18  2:32 PM  Result Value Ref Range   T3 Uptake 36 (H) 22 - 35 %   T4, Total 5.4 5.1 - 11.9 mcg/dL   Free Thyroxine Index 1.9 1.4 - 3.8   TSH 2.33 mIU/L    Comment:           Reference Range .           > or = 20 Years  0.40-4.50 .                Pregnancy Ranges           First trimester    0.26-2.66           Second trimester   0.55-2.73           Third trimester    0.43-2.91   Hepatitis C antibody     Status: None   Collection Time: 09/09/18  2:32 PM  Result Value Ref Range   Hepatitis C Ab NON-REACTIVE NON-REACTI   SIGNAL TO CUT-OFF 0.01 <1.00    Comment: . HCV antibody was non-reactive. There is no laboratory  evidence of HCV infection. . In most cases, no further action is required. However, if recent HCV exposure is suspected, a test for HCV RNA (test code [phone]) is suggested. . For additional information please refer to http://education.[facility].com/faq/FAQ22v1 (This link is being provided for informational/ educational purposes only.) .   HIV Antibody (routine testing w rflx)     Status: None   Collection Time: 09/09/18  2:32 PM  Result Value Ref Range   HIV 1&2 Ab, 4th Generation NON-REACTIVE NON-REACTI    Comment: HIV-1 antigen and HIV-1/HIV-2 antibodies were not detected. There is no laboratory evidence of HIV infection. Marland Kitchen PLEASE NOTE: This information has been disclosed to you from records whose confidentiality may be protected by state law.  If your state requires such protection, then the state law prohibits you from making any further disclosure of the information without the specific written consent of the person to whom it pertains, or as otherwise permitted by law. A general authorization for the release of medical or other information is NOT sufficient for this purpose. . For additional information please refer to http://education.[facility].com/faq/FAQ106 (This link is being provided for informational/ educational  purposes only.) . Marland Kitchen The performance of this assay has not been clinically validated in patients less than 10 years old. .     PHQ2/9: Depression screen [facility] 2/9  10/10/2018 09/09/2018  Decreased Interest 0 3  Down, Depressed, Hopeless 0 0  PHQ - 2 Score 0 3  Altered sleeping 0 3  Tired, decreased energy 0 3  Change in appetite 0 3  Feeling bad or failure about yourself  0 1  Trouble concentrating 0 2  Moving slowly or fidgety/restless 0 1  Suicidal thoughts 0 0  PHQ-9 Score 0 16  Difficult doing work/chores Not difficult at all Somewhat difficult    Fall Risk: Fall Risk  10/10/2018 09/09/2018  Falls in the past year? 0 0  Number falls in past yr: 0 0  Injury with Fall? 0 0   Functional Status Survey: Is the patient deaf or have difficulty hearing?: No Does the patient have difficulty seeing, even when wearing glasses/contacts?: Yes Does the patient have difficulty concentrating, remembering, or making decisions?: No Does the patient have difficulty walking or climbing stairs?: No Does the patient have difficulty dressing or bathing?: No Does the patient have difficulty doing errands alone such as visiting a doctor's office or shopping?: No  Assessment & Plan  1. Well woman exam with routine gynecological exam -USPSTF grade A and B recommendations reviewed with patient; age-appropriate recommendations, preventive care, screening tests, etc discussed and encouraged; healthy living encouraged; see AVS for patient education given to patient -Discussed importance of 150 minutes of physical activity weekly, eat two servings of fish weekly, eat one serving of tree nuts ( cashews, pistachios, pecans, almonds.Marland Kitchen) every other day, eat 6 servings of fruit/vegetables daily and drink plenty of water and avoid sweet beverages.  - Cytology - PAP  2. Gastroesophageal reflux disease without esophagitis - omeprazole (PRILOSEC) 20 MG capsule; Take 1 capsule (20 mg total) by mouth 2 (two) times daily  before a meal.  Dispense: 60 capsule; Refill: 1  3. Fatigue, unspecified type - buPROPion (WELLBUTRIN XL) 300 MG 24 hr tablet; Take 1 tablet (300 mg total) by mouth daily.  Dispense: 90 tablet; Refill: 1  4. Unintentional weight loss - buPROPion (WELLBUTRIN XL) 300 MG 24 hr tablet; Take 1 tablet (300 mg total) by mouth daily.  Dispense: 90 tablet; Refill: 1  5. Encounter for tobacco use cessation counseling - buPROPion (WELLBUTRIN XL) 300 MG 24 hr tablet; Take 1 tablet (300 mg total) by mouth daily.  Dispense: 90 tablet; Refill: 1  6. Current moderate episode of major depressive disorder without prior episode (HCC) - buPROPion (WELLBUTRIN XL) 300 MG 24 hr tablet; Take 1 tablet (300 mg total) by mouth daily.  Dispense: 90 tablet; Refill: 1

## 2018-10-15 LAB — CYTOLOGY - PAP
Diagnosis: NEGATIVE
HPV: NOT DETECTED

## 2018-11-01 ENCOUNTER — Other Ambulatory Visit: Payer: Self-pay | Admitting: Family Medicine

## 2018-11-01 DIAGNOSIS — K219 Gastro-esophageal reflux disease without esophagitis: Secondary | ICD-10-CM

## 2019-03-28 ENCOUNTER — Encounter: Payer: Self-pay | Admitting: Emergency Medicine

## 2019-03-28 ENCOUNTER — Other Ambulatory Visit: Payer: Self-pay

## 2019-03-28 ENCOUNTER — Ambulatory Visit
Admission: EM | Admit: 2019-03-28 | Discharge: 2019-03-28 | Disposition: A | Discharge status: Home / Self Care | Payer: BC Managed Care – PPO | Attending: Family Medicine | Admitting: Family Medicine

## 2019-03-28 DIAGNOSIS — J069 Acute upper respiratory infection, unspecified: Secondary | ICD-10-CM | POA: Diagnosis not present

## 2019-03-28 NOTE — ED Provider Notes (Signed)
[facility]    CSN: [id number] Arrival date & time: 03/28/19  1318      History   Chief Complaint Chief Complaint  Patient presents with  . Headache  . Cough  . Chills    HPI Leslie [name] is a 33 y.o. female.   33 yo female with a c/o cough, congestion, nausea, chills, headaches since yesterday. Denies any fevers, vomiting, shortness of breath.    Headache Associated symptoms: cough   Cough Associated symptoms: headaches     Past Medical History:  Diagnosis Date  . Anxiety   . Back injury   . Bulging lumbar disc   . Complication of anesthesia   . Family history of adverse reaction to anesthesia    N/V-Mom  . GERD (gastroesophageal reflux disease)   . Headache    H/O  . PONV (postoperative nausea and vomiting)     Patient Active Problem List   Diagnosis Date Noted  . Encounter for tobacco use cessation counseling 09/09/2018  . Fatigue 09/09/2018  . Current moderate episode of major depressive disorder without prior episode ([place]) 09/09/2018  . Gastroesophageal reflux disease without esophagitis 09/09/2018  . Cholecystitis, chronic 02/23/2016  . Lumbosacral radiculopathy 01/07/2015    Past Surgical History:  Procedure Laterality Date  . BREAST SURGERY     augmentation  . CESAREAN SECTION  2005  . CESAREAN SECTION  2016  . CESAREAN SECTION  2005, 2016  . CHOLECYSTECTOMY N/A 02/14/2016   Procedure: LAPAROSCOPIC CHOLECYSTECTOMY WITH INTRAOPERATIVE CHOLANGIOGRAM;  Surgeon: Robert Bellow, MD;  Location: [facility] ORS;  Service: General;  Laterality: N/A;  . TONSILLECTOMY AND ADENOIDECTOMY  1987  . TONSILLECTOMY AND ADENOIDECTOMY  1987    OB History    Gravida  5   Para  2   Term  0   Preterm  0   AB  2   Living        SAB  1   TAB  0   Ectopic  0   Multiple      Live Births           Obstetric Comments  1st Menstrual Cycle:  10  1st Pregnancy:  17  1st Menstrual Cycle:  10 1st Pregnancy:  17          Home  Medications    Prior to Admission medications   Medication Sig Start Date End Date Taking? Authorizing Provider  levonorgestrel (MIRENA) 20 MCG/24HR IUD 1 each by Intrauterine route once.   Yes [provider]  omeprazole (PRILOSEC) 20 MG capsule Take 1 capsule (20 mg total) by mouth 2 (two) times daily before a meal. 10/10/18  Yes Hubbard Hartshorn, FNP  buPROPion (WELLBUTRIN XL) 300 MG 24 hr tablet Take 1 tablet (300 mg total) by mouth daily. 10/10/18   Hubbard Hartshorn, FNP  ibuprofen (ADVIL,MOTRIN) 200 MG tablet Take 200 mg by mouth every 6 (six) hours as needed for moderate pain.    [provider]    Family History Family History  Problem Relation Age of Onset  . Diabetes Mother   . Thyroid disease Maternal Aunt   . Ovarian cancer Maternal Grandmother   . Heart attack Maternal Grandmother   . Cervical cancer Maternal Grandmother   . Heart attack Maternal Grandfather   . Emphysema Paternal Grandmother     Social History Social History   Tobacco Use  . Smoking status: Current Every Day Smoker    Packs/day: 0.15  Years: 15.00    Pack years: 2.25    Types: Cigarettes  . Smokeless tobacco: Never Used  Substance Use Topics  . Alcohol use: Yes    Alcohol/week: 0.0 standard drinks    Comment: wine WEEKENDS  . Drug use: No    Comment: pt denies during phone interview but UDS was + for marijuana in 2016     Allergies   Amoxicillin, Bactrim [sulfamethoxazole-trimethoprim], Penicillins, and Tape   Review of Systems Review of Systems  Respiratory: Positive for cough.   Neurological: Positive for headaches.     Physical Exam Triage Vital Signs ED Triage Vitals  Enc Vitals Group     BP 03/28/19 1338 121/83     Pulse Rate 03/28/19 1338 80     Resp 03/28/19 1338 14     Temp 03/28/19 1338 98.3 F (36.8 C)     Temp Source 03/28/19 1338 Oral     SpO2 03/28/19 1338 99 %     Weight 03/28/19 1334 120 lb (54.4 kg)     Height 03/28/19 1334 5\' 5"  (1.651 m)      Head Circumference --      Peak Flow --      Pain Score 03/28/19 1334 4     Pain Loc --      Pain Edu? --      Excl. in GC? --    No data found.  Updated Vital Signs BP 121/83 (BP Location: Left Arm)   Pulse 80   Temp 98.3 F (36.8 C) (Oral)   Resp 14   Ht 5\' 5"  (1.651 m)   Wt 54.4 kg   SpO2 99%   BMI 19.97 kg/m   Visual Acuity Right Eye Distance:   Left Eye Distance:   Bilateral Distance:    Right Eye Near:   Left Eye Near:    Bilateral Near:     Physical Exam Vitals and nursing note reviewed.  Constitutional:      General: She is not in acute distress.    Appearance: She is not toxic-appearing or diaphoretic.  Cardiovascular:     Rate and Rhythm: Normal rate.  Pulmonary:     Effort: Pulmonary effort is normal. No respiratory distress.  Neurological:     Mental Status: She is alert.      UC Treatments / Results  Labs (all labs ordered are listed, but only abnormal results are displayed) Labs Reviewed  NOVEL CORONAVIRUS, NAA (HOSP ORDER, SEND-OUT TO REF LAB; TAT 18-24 HRS)    EKG   Radiology No results found.  Procedures Procedures (including critical care time)  Medications Ordered in UC Medications - No data to display  Initial Impression / Assessment and Plan / UC Course  I have reviewed the triage vital signs and the nursing notes.  Pertinent labs & imaging results that were available during my care of the patient were reviewed by me and considered in my medical decision making (see chart for details).      Final Clinical Impressions(s) / UC Diagnoses   Final diagnoses:  Viral URI with cough     Discharge Instructions     Rest, fluids, over the counter medications as needed    ED Prescriptions    None      1. diagnosis reviewed with patient 2. Recommend supportive treatment as above 3. covid test done 4. Follow-up prn   PDMP not reviewed this encounter.   03/30/19, MD 03/28/19 [phone]

## 2019-03-28 NOTE — Discharge Instructions (Signed)
Rest, fluids, over the counter medications as needed  

## 2019-03-28 NOTE — ED Triage Notes (Signed)
Patient c/o cough, congestion, headache, nausea, and chills that started yesterday.  Patient denies fevers.

## 2019-03-29 LAB — NOVEL CORONAVIRUS, NAA (HOSP ORDER, SEND-OUT TO REF LAB; TAT 18-24 HRS): SARS-CoV-2, NAA: NOT DETECTED

## 2019-04-09 ENCOUNTER — Encounter: Payer: Self-pay | Admitting: Family Medicine

## 2019-04-30 ENCOUNTER — Ambulatory Visit (INDEPENDENT_AMBULATORY_CARE_PROVIDER_SITE_OTHER): Payer: BC Managed Care – PPO | Admitting: Family Medicine

## 2019-04-30 ENCOUNTER — Encounter: Payer: Self-pay | Admitting: Family Medicine

## 2019-04-30 ENCOUNTER — Other Ambulatory Visit: Payer: Self-pay

## 2019-04-30 DIAGNOSIS — L659 Nonscarring hair loss, unspecified: Secondary | ICD-10-CM

## 2019-04-30 DIAGNOSIS — R5383 Other fatigue: Secondary | ICD-10-CM

## 2019-04-30 NOTE — Progress Notes (Signed)
Name: Leslie [name]   MRN: [id number]    DOB: 04-05-1986   Date:04/30/2019       Progress Note  Subjective  Chief Complaint  Chief Complaint  Patient presents with  . Alopecia    I connected with  Sherald Hess Vahey  on 04/30/19 at  9:40 AM EST by a video enabled telemedicine application and verified that I am speaking with the correct person using two identifiers.  I discussed the limitations of evaluation and management by telemedicine and the availability of in person appointments. The patient expressed understanding and agreed to proceed. Staff also discussed with the patient that there may be a patient responsible charge related to this service. Patient Location: Home Provider Location: Home Office Additional Individuals present: None  HPI  Pt presents with concern for alopecia.  She has been experiencing this for about a year now.  Did have laboratory evaluation with Thyroid panel, CBC, CMP all of which were non-contributory.  She has been taking biotin, taking multivitamin.  She has been avoiding brushing her hair when itis wet.  She notes the hair is still really thinning in the front as well as certain patches of thinning throughout the scalp.  Does still have fatigue as well.  We will check labs as per orders below.  Not wearing tight hairstyles, using comb instead of brush, not washing hair as often; no family history of similar issues.   Patient Active Problem List   Diagnosis Date Noted  . Encounter for tobacco use cessation counseling 09/09/2018  . Fatigue 09/09/2018  . Current moderate episode of major depressive disorder without prior episode (HCC) 09/09/2018  . Gastroesophageal reflux disease without esophagitis 09/09/2018  . Cholecystitis, chronic 02/23/2016  . Lumbosacral radiculopathy 01/07/2015    Social History   Tobacco Use  . Smoking status: Current Every Day Smoker    Packs/day: 0.15    Years: 15.00    Pack years: 2.25    Types: Cigarettes  . Smokeless  tobacco: Never Used  Substance Use Topics  . Alcohol use: Yes    Alcohol/week: 0.0 standard drinks    Comment: wine WEEKENDS     Current Outpatient Medications:  .  ibuprofen (ADVIL,MOTRIN) 200 MG tablet, Take 200 mg by mouth every 6 (six) hours as needed for moderate pain., Disp: , Rfl:  .  levonorgestrel (MIRENA) 20 MCG/24HR IUD, 1 each by Intrauterine route once., Disp: , Rfl:  .  omeprazole (PRILOSEC) 20 MG capsule, Take 1 capsule (20 mg total) by mouth 2 (two) times daily before a meal., Disp: 60 capsule, Rfl: 1 .  buPROPion (WELLBUTRIN XL) 300 MG 24 hr tablet, Take 1 tablet (300 mg total) by mouth daily. (Patient not taking: Reported on 04/30/2019), Disp: 90 tablet, Rfl: 1  Allergies  Allergen Reactions  . Amoxicillin Hives    Has patient had a PCN reaction causing immediate rash, facial/tongue/throat swelling, SOB or lightheadedness with hypotension: {no Has patient had a PCN reaction causing severe rash involving mucus membranes or skin necrosis: no Has patient had a PCN reaction that required hospitalization no Has patient had a PCN reaction occurring within the last 10 years: no If all of the above answers are "NO", then may proceed with Cephalosporin use.  . Bactrim [Sulfamethoxazole-Trimethoprim] Hives  . Penicillins Hives    Has patient had a PCN reaction causing immediate rash, facial/tongue/throat swelling, SOB or lightheadedness with hypotension: {no Has patient had a PCN reaction causing severe rash involving mucus membranes or skin  necrosis: {no Has patient had a PCN reaction that required hospitalization no Has patient had a PCN reaction occurring within the last 10 years: {no If all of the above answers are "NO", then may proceed with Cephalosporin use.  . Tape Rash    PT TOLERATES PAPER TAPE WELL    I personally reviewed active problem list, medication list, allergies, notes from last encounter, lab results with the patient/caregiver today.  ROS  Ten systems  reviewed and is negative except as mentioned in HPI  Objective  Virtual encounter, vitals not obtained.  There is no height or weight on file to calculate BMI.  Nursing Note and Vital Signs reviewed.  Physical Exam  Constitutional: Patient appears well-developed and well-nourished. No distress.  HENT: Head: Normocephalic and atraumatic.  Neck: Normal range of motion. Pulmonary/Chest: Effort normal. No respiratory distress. Speaking in complete sentences Neurological: Pt is alert and oriented to person, place, and time. Coordination, speech and gait are normal.  Psychiatric: Patient has a normal mood and affect. behavior is normal. Judgment and thought content normal. Skin: there is thinned hair along hairline on the forehead/frontal scalp.  No results found for this or any previous visit (from the past 72 hour(s)).  Assessment & Plan  1. Hair loss - CBC with Differential/Platelet - VITAMIN D 25 Hydroxy (Vit-D Deficiency, Fractures) - B12 and Folate Panel - Iron, TIBC and Ferritin Panel - Ambulatory referral to Dermatology  2. Fatigue, unspecified type - CBC with Differential/Platelet - VITAMIN D 25 Hydroxy (Vit-D Deficiency, Fractures) - B12 and Folate Panel - Iron, TIBC and Ferritin Panel  - I discussed the assessment and treatment plan with the patient. The patient was provided an opportunity to ask questions and all were answered. The patient agreed with the plan and demonstrated an understanding of the instructions.  I provided 10 minutes of non-face-to-face time during this encounter.  Hubbard Hartshorn, FNP

## 2019-08-18 DIAGNOSIS — M545 Low back pain: Secondary | ICD-10-CM | POA: Diagnosis not present

## 2019-09-09 DIAGNOSIS — M545 Low back pain: Secondary | ICD-10-CM | POA: Diagnosis not present

## 2019-11-12 ENCOUNTER — Encounter: Payer: Self-pay | Admitting: Internal Medicine

## 2019-11-12 ENCOUNTER — Other Ambulatory Visit: Payer: Self-pay

## 2019-11-12 ENCOUNTER — Telehealth (INDEPENDENT_AMBULATORY_CARE_PROVIDER_SITE_OTHER): Payer: Self-pay | Admitting: Internal Medicine

## 2019-11-12 VITALS — Ht 65.0 in | Wt 128.0 lb

## 2019-11-12 DIAGNOSIS — R5383 Other fatigue: Secondary | ICD-10-CM

## 2019-11-12 DIAGNOSIS — J069 Acute upper respiratory infection, unspecified: Secondary | ICD-10-CM

## 2019-11-12 DIAGNOSIS — J00 Acute nasopharyngitis [common cold]: Secondary | ICD-10-CM

## 2019-11-12 NOTE — Progress Notes (Signed)
Name: Leslie [name]   MRN: [id number]    DOB: [birth year]   Date:11/12/2019       Progress Note  Subjective  Chief Complaint  Chief Complaint  Patient presents with  . Nasal Congestion    started about a week ago, gradually got worse day by day  . Headache    A lot of head pressure, No smell, no taste, has not been tested for Covid yet  . Diarrhea    started this morning    I connected with  [name] on 11/12/19 at  2:20 PM EDT by telephone and verified that I am speaking with the correct person using two identifiers.  I discussed the limitations, risks, security and privacy concerns of performing an evaluation and management service by telephone and the availability of in person appointments. The patient expressed understanding and agreed to proceed. Staff also discussed with the patient that there may be a patient responsible charge related to this service. Patient Location: Home Provider Location: [facility] Additional Individuals present: none  HPI Patient is a 34 year old female patient of [name] today with the above complaints by a phone visit Symptoms started with nasal congestion about a week ago, and has gradually increased + cough, no production - dry No marked SOB (except with cough and can't breathe thru nose) No fever, not feeling feverish No sore throat (excpet sometimes in am when awakens)  + congestion + loss of smell, loss of taste started yesterday No N/V + muscle aches ("sore" and very tired) + loose stools/diarrhea started this morning, 4 so far today, no blood No CP,  passing out episodes Has tried sudafed, zyrtec,  Trying to stay hydrated  Comorbid conditions reviewed No asthma/COPD hx,  No h/o DM, heart disease, CKD,  Tob - 5 cigs a day and in process of quitting  Went to [employer] yesterday and couldn't taste, had coffee this morning and couldn't taste. 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  Not had  vaccine   Patient Active Problem List   Diagnosis Date Noted  . Encounter for tobacco use cessation counseling 09/09/2018  . Fatigue 09/09/2018  . Current moderate episode of major depressive disorder without prior episode (HCC) 09/09/2018  . Gastroesophageal reflux disease without esophagitis 09/09/2018  . Cholecystitis, chronic 02/23/2016  . Lumbosacral radiculopathy 01/07/2015    Past Surgical History:  Procedure Laterality Date  . BREAST SURGERY     augmentation  . CESAREAN SECTION  2005  . CESAREAN SECTION  2016  . CESAREAN SECTION  2005, 2016  . CHOLECYSTECTOMY N/A 02/14/2016   Procedure: LAPAROSCOPIC CHOLECYSTECTOMY WITH INTRAOPERATIVE CHOLANGIOGRAM;  Surgeon: Earline Mayotte, MD;  Location: [facility] ORS;  Service: General;  Laterality: N/A;  . TONSILLECTOMY AND ADENOIDECTOMY  1987  . TONSILLECTOMY AND ADENOIDECTOMY  1987    Family History  Problem Relation Age of Onset  . Diabetes Mother   . Thyroid disease Maternal Aunt   . Ovarian cancer Maternal Grandmother   . Heart attack Maternal Grandmother   . Cervical cancer Maternal Grandmother   . Heart attack Maternal Grandfather   . Emphysema Paternal Grandmother     Social History   Tobacco Use  . Smoking status: Current Every Day Smoker    Packs/day: 0.15    Years: 15.00    Pack years: 2.25    Types: Cigarettes  . Smokeless tobacco: Never Used  Substance Use Topics  . Alcohol use: Yes    Alcohol/week: 0.0  standard drinks    Comment: wine WEEKENDS     Current Outpatient Medications:  .  ibuprofen (ADVIL,MOTRIN) 200 MG tablet, Take 200 mg by mouth every 6 (six) hours as needed for moderate pain., Disp: , Rfl:  .  levonorgestrel (MIRENA) 20 MCG/24HR IUD, 1 each by Intrauterine route once., Disp: , Rfl:  .  omeprazole (PRILOSEC) 20 MG capsule, Take 1 capsule (20 mg total) by mouth 2 (two) times daily before a meal., Disp: 60 capsule, Rfl: 1  Allergies  Allergen Reactions  . Amoxicillin Hives    Has patient  had a PCN reaction causing immediate rash, facial/tongue/throat swelling, SOB or lightheadedness with hypotension: {no Has patient had a PCN reaction causing severe rash involving mucus membranes or skin necrosis: no Has patient had a PCN reaction that required hospitalization no Has patient had a PCN reaction occurring within the last 10 years: no If all of the above answers are "NO", then may proceed with Cephalosporin use.  . Bactrim [Sulfamethoxazole-Trimethoprim] Hives  . Penicillins Hives    Has patient had a PCN reaction causing immediate rash, facial/tongue/throat swelling, SOB or lightheadedness with hypotension: {no Has patient had a PCN reaction causing severe rash involving mucus membranes or skin necrosis: {no Has patient had a PCN reaction that required hospitalization no Has patient had a PCN reaction occurring within the last 10 years: {no If all of the above answers are "NO", then may proceed with Cephalosporin use.  . Tape Rash    PT TOLERATES PAPER TAPE WELL    With staff assistance, above reviewed with the patient today.  ROS: As per HPI, otherwise no specific complaints on a limited and focused system review   Objective  Virtual encounter, vitals not obtained.  Body mass index is 21.3 kg/m.  Physical Exam   Appears in NAD via conversation, obvious nasal congestion noted by voice, with some intermittent cough noted on the phone call Breathing: No obvious respiratory distress. Speaking in complete sentences Neurological: Pt is alert and oriented, Speech is normal Psychiatric: Patient has a normal mood and affect, behavior is normal. Judgment and thought content normal.   No results found for this or any previous visit (from the past 72 hour(s)).  PHQ2/9: Depression screen [facility] 2/9 11/12/2019 04/30/2019 10/10/2018 09/09/2018  Decreased Interest 0 0 0 3  Down, Depressed, Hopeless 0 0 0 0  PHQ - 2 Score 0 0 0 3  Altered sleeping 0 0 0 3  Tired, decreased energy 0 0 0  3  Change in appetite 0 0 0 3  Feeling bad or failure about yourself  0 0 0 1  Trouble concentrating 0 0 0 2  Moving slowly or fidgety/restless 0 0 0 1  Suicidal thoughts 0 0 0 0  PHQ-9 Score 0 0 0 16  Difficult doing work/chores Not difficult at all Not difficult at all Not difficult at all Somewhat difficult   PHQ-2/9 Result reviewed  Fall Risk: Fall Risk  11/12/2019 04/30/2019 10/10/2018 09/09/2018  Falls in the past year? 1 0 0 0  Number falls in past yr: 0 0 0 0  Injury with Fall? 1 0 0 0  Follow up - Falls evaluation completed - -     Assessment & Plan 1. Viral upper respiratory tract infection 2. Acute rhinitis 3. Other fatigue Discussed with patient's concerns with her symptoms and the strong suspicion for Covid, especially with the recent loss of taste and smell and some GI symptoms this morning noted. Do  feel needs to get tested for Covid, and can [facility], and asked that she call them to make an appointment and get tested today if possible as she noted she would do so. Emphasized also quarantine/isolate until testing is returned. Best not to be out at places like restaurants presently like she was last night. She is to notify us of the test results when they are obtained.  Educated on Covid and management recommendations if positive, including the importance of isolation until symptoms have resolved off of fever reducing medicines and the contagious concerns with this. Recommend symptomatic measures - stay well hydrated, rest, a mucinex DM or robitussin DM type OTC product for the mild cough/ congestion and tylenol prn for any low grade temps/fevers/aches  If sx's worsening acutely, such as increased SOB, fevers, weakness or other concerning sx's arise, should f/u and may need to be seen more emergently in the ER for a more immediate evaluation, and patient was understanding of this.  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 If the Covid test is positive, the same as above holds, and often recommend also waiting 10 days from the time symptoms have started.  Await the Covid test result presently, and treating symptomatically recommended as await that result.  I discussed the assessment and treatment plan with the patient. The patient was provided an opportunity to ask questions and all were answered. The patient agreed with the plan and demonstrated an understanding of the instructions.  Red flags and when to present for emergency care or RTC including fevers, chest pain, shortness of breath, new/worsening/un-resolving symptoms reviewed with patient at time of visit.   The patient was advised to call back or seek an in-person evaluation if the symptoms worsen or if the condition fails to improve as anticipated.  I provided 20 minutes of non-face-to-face time during this encounter that included discussing at length patient's sx/history, pertinent pmhx, medications, treatment and follow up plan. This time also included the necessary documentation, orders, and chart review.  Jamelle Haring, MD

## 2019-11-13 ENCOUNTER — Ambulatory Visit
Admission: EM | Admit: 2019-11-13 | Discharge: 2019-11-13 | Disposition: A | Discharge status: Home / Self Care | Payer: HRSA Program | Attending: Emergency Medicine | Admitting: Emergency Medicine

## 2019-11-13 ENCOUNTER — Other Ambulatory Visit: Payer: Self-pay

## 2019-11-13 DIAGNOSIS — J0140 Acute pansinusitis, unspecified: Secondary | ICD-10-CM | POA: Diagnosis present

## 2019-11-13 DIAGNOSIS — Z20822 Contact with and (suspected) exposure to covid-19: Secondary | ICD-10-CM | POA: Diagnosis present

## 2019-11-13 DIAGNOSIS — J069 Acute upper respiratory infection, unspecified: Secondary | ICD-10-CM

## 2019-11-13 MED ORDER — DOXYCYCLINE HYCLATE 100 MG PO CAPS: 100.0000 mg | ORAL_CAPSULE | Freq: Two times a day (BID) | ORAL | 0 refills | Status: AC

## 2019-11-13 MED ORDER — BENZONATATE 200 MG PO CAPS: 200.0000 mg | ORAL_CAPSULE | Freq: Three times a day (TID) | ORAL | 0 refills | Status: DC | PRN

## 2019-11-13 MED ORDER — FLUTICASONE PROPIONATE 50 MCG/ACT NA SUSP: 2.0000 | Freq: Every day | NASAL | 0 refills | Status: DC

## 2019-11-13 MED ORDER — IBUPROFEN 600 MG PO TABS: 600.0000 mg | ORAL_TABLET | Freq: Four times a day (QID) | ORAL | 0 refills | Status: DC | PRN

## 2019-11-13 NOTE — ED Provider Notes (Signed)
HPI  SUBJECTIVE:  Leslie [name] is a 34 y.o. female who presents with 5 to 6 days of greenish nasal congestion, rhinorrhea, frontal and right maxillary pain.  She reports body aches, chills, upper dental pain, sore throat due to her cough.  She reports postnasal drip, loss of smell and taste, cough productive of the same material as her nasal congestion, wheezing, nausea and diarrhea.  Reports crampy abdominal pain prior to having a bowel movement.  It resolves afterwards.  No fevers, facial swelling, shortness of breath, vomiting.  No known Covid exposures.  She has not yet gotten the vaccine.  States that she is unable to sleep at night secondary to the cough.  Reports right ear pain and otorrhea.  No change in her hearing.  States that she was sent here by her PMD for Covid test.  No antibiotics in the past month.  No antipyretic in the past 6 hours.  She tried her mother's Valium, Zyrtec and also milligrams of Tylenol as needed.  The Tylenol helps.  Symptoms are worse with bending forward, lying down.  She is a smoker.  No history of pulmonary disease, diabetes, hypertension, chronic kidney disease, coronary artery disease, HIV, cancer, immunocompromise.  States that she gets sinusitis once a year.  No history of allergies.  LMP: Has an IUD.  Denies the possibility of being pregnant.  PMD: Doren Custard, FNP   Past Medical History:  Diagnosis Date  . Anxiety   . Back injury   . Bulging lumbar disc   . Complication of anesthesia   . Family history of adverse reaction to anesthesia    N/V-Mom  . GERD (gastroesophageal reflux disease)   . Headache    H/O  . PONV (postoperative nausea and vomiting)     Past Surgical History:  Procedure Laterality Date  . BREAST SURGERY     augmentation  . CESAREAN SECTION  2005  . CESAREAN SECTION  2016  . CESAREAN SECTION  2005, 2016  . CHOLECYSTECTOMY N/A 02/14/2016   Procedure: LAPAROSCOPIC CHOLECYSTECTOMY WITH INTRAOPERATIVE CHOLANGIOGRAM;  Surgeon:  Earline Mayotte, MD;  Location: [facility] ORS;  Service: General;  Laterality: N/A;  . TONSILLECTOMY AND ADENOIDECTOMY  1987  . TONSILLECTOMY AND ADENOIDECTOMY  1987    Family History  Problem Relation Age of Onset  . Diabetes Mother   . Thyroid disease Maternal Aunt   . Ovarian cancer Maternal Grandmother   . Heart attack Maternal Grandmother   . Cervical cancer Maternal Grandmother   . Heart attack Maternal Grandfather   . Emphysema Paternal Grandmother     Social History   Tobacco Use  . Smoking status: Current Every Day Smoker    Packs/day: 0.15    Years: 15.00    Pack years: 2.25    Types: Cigarettes  . Smokeless tobacco: Never Used  Vaping Use  . Vaping Use: Never used  Substance Use Topics  . Alcohol use: Yes    Alcohol/week: 0.0 standard drinks    Comment: wine WEEKENDS  . Drug use: No    Comment: pt denies during phone interview but UDS was + for marijuana in 2016    No current facility-administered medications for this encounter.  Current Outpatient Medications:  .  levonorgestrel (MIRENA) 20 MCG/24HR IUD, 1 each by Intrauterine route once., Disp: , Rfl:  .  omeprazole (PRILOSEC) 20 MG capsule, Take 1 capsule (20 mg total) by mouth 2 (two) times daily before a meal., Disp: 60 capsule, Rfl: 1 .  benzonatate (TESSALON) 200 MG capsule, Take 1 capsule (200 mg total) by mouth 3 (three) times daily as needed for cough., Disp: 30 capsule, Rfl: 0 .  doxycycline (VIBRAMYCIN) 100 MG capsule, Take 1 capsule (100 mg total) by mouth 2 (two) times daily for 7 days., Disp: 14 capsule, Rfl: 0 .  fluticasone (FLONASE) 50 MCG/ACT nasal spray, Place 2 sprays into both nostrils daily., Disp: 16 g, Rfl: 0 .  ibuprofen (ADVIL) 600 MG tablet, Take 1 tablet (600 mg total) by mouth every 6 (six) hours as needed., Disp: 30 tablet, Rfl: 0  Allergies  Allergen Reactions  . Amoxicillin Hives    Has patient had a PCN reaction causing immediate rash, facial/tongue/throat swelling, SOB or  lightheadedness with hypotension: {no Has patient had a PCN reaction causing severe rash involving mucus membranes or skin necrosis: no Has patient had a PCN reaction that required hospitalization no Has patient had a PCN reaction occurring within the last 10 years: no If all of the above answers are "NO", then may proceed with Cephalosporin use.  . Bactrim [Sulfamethoxazole-Trimethoprim] Hives  . Penicillins Hives    Has patient had a PCN reaction causing immediate rash, facial/tongue/throat swelling, SOB or lightheadedness with hypotension: {no Has patient had a PCN reaction causing severe rash involving mucus membranes or skin necrosis: {no Has patient had a PCN reaction that required hospitalization no Has patient had a PCN reaction occurring within the last 10 years: {no If all of the above answers are "NO", then may proceed with Cephalosporin use.  . Tape Rash    PT TOLERATES PAPER TAPE WELL     ROS  As noted in HPI.   Physical Exam  BP (!) 135/95 (BP Location: Left Arm)   Pulse (!) 101   Temp 98.3 F (36.8 C) (Oral)   Resp 18   Ht 5\' 5"  (1.651 m)   Wt 58.1 kg   SpO2 100%   BMI 21.30 kg/m   Constitutional: Well developed, well nourished, appears uncomfortable  eyes:  EOMI, conjunctiva normal bilaterally HENT: Normocephalic, atraumatic,mucus membranes moist.  Extensive nasal congestion, positive bilateral turbinate swelling.  Exquisite maxillary and frontal sinus tenderness worse on the right.  Tonsils surgically absent.  No obvious postnasal drip. Neck: No cervical lymphadenopathy Respiratory: Normal inspiratory effort, lungs clear bilaterally.  Good air movement.  Positive anterior and lateral chest wall tenderness Cardiovascular: Mild tachycardia, no murmurs rubs or gallops GI: nondistended skin: No rash, skin intact Musculoskeletal: no deformities Neurologic: Alert & oriented x 3, no focal neuro deficits Psychiatric: Speech and behavior appropriate   ED  Course   Medications - No data to display  Orders Placed This Encounter  Procedures  . SARS CORONAVIRUS 2 (TAT 6-24 HRS) Nasopharyngeal Nasopharyngeal Swab    Standing Status:   Standing    Number of Occurrences:   1    Order Specific Question:   Is this test for diagnosis or screening    Answer:   Diagnosis of ill patient    Order Specific Question:   Symptomatic for COVID-19 as defined by CDC    Answer:   Yes    Order Specific Question:   Date of Symptom Onset    Answer:   11/08/2019    Order Specific Question:   Hospitalized for COVID-19    Answer:   No    Order Specific Question:   Admitted to ICU for COVID-19    Answer:   No    Order Specific Question:  Previously tested for COVID-19    Answer:   Yes    Order Specific Question:   Resident in a congregate (group) care setting    Answer:   No    Order Specific Question:   Employed in healthcare setting    Answer:   No    Order Specific Question:   Pregnant    Answer:   No    Order Specific Question:   Has patient completed COVID vaccination(s) (2 doses of Pfizer/Moderna 1 dose of [employer])    Answer:   No    No results found for this or any previous visit (from the past 24 hour(s)). No results found.  ED Clinical Impression  1. Viral URI   2. Encounter for laboratory testing for COVID-19 virus   3. Acute non-recurrent pansinusitis      ED Assessment/Plan  Covid PCR sent. Will send home with saline nasal irrigation, Flonase, ibuprofen 600 mg combined with 1000 mg of Tylenol 3-4 times a day, Tessalon, Afrin for maximum 3 days.  Mucinex D.  Wait-and-see prescription of doxycycline if the Covid test is negative due to severity of symptoms.  Work note for 2 days.  With PMD as needed.  To the ER if she gets worse.  Discussed  MDM, treatment plan, and plan for follow-up with patient. Discussed sn/sx that should prompt return to the ED. patient agrees with plan.   Meds ordered this encounter  Medications  .  doxycycline (VIBRAMYCIN) 100 MG capsule    Sig: Take 1 capsule (100 mg total) by mouth 2 (two) times daily for 7 days.    Dispense:  14 capsule    Refill:  0  . fluticasone (FLONASE) 50 MCG/ACT nasal spray    Sig: Place 2 sprays into both nostrils daily.    Dispense:  16 g    Refill:  0  . ibuprofen (ADVIL) 600 MG tablet    Sig: Take 1 tablet (600 mg total) by mouth every 6 (six) hours as needed.    Dispense:  30 tablet    Refill:  0  . benzonatate (TESSALON) 200 MG capsule    Sig: Take 1 capsule (200 mg total) by mouth 3 (three) times daily as needed for cough.    Dispense:  30 capsule    Refill:  0    *This clinic note was created using Scientist, clinical (histocompatibility and immunogenetics). Therefore, there may be occasional mistakes despite careful proofreading.   ?    Domenick Gong, MD 11/13/19 1350

## 2019-11-13 NOTE — Discharge Instructions (Addendum)
Your Covid test will take 24 hours to come back.  We will contact you if it is positive.  Saline nasal irrigation with a [employer] sinus rinse and distilled water as often as you want, Flonase which may take several days to start working, but will work, ibuprofen 600 mg combined with 1000 mg of Tylenol 3-4 times a day, Tessalon for the cough, Afrin twice a day for maximum 3 days.  Mucinex D for nasal congestion.  Wait-and-see prescription of doxycycline if the Covid test is negative.  Get a pulse oximeter if  your Covid test is positive.  Go to the hospital if it oxygen saturations below 90%.  Follow-up with your doctor as needed.  Go to the ER if you get worse or for any other concerns or for the signs and symptoms we discussed .

## 2019-11-13 NOTE — ED Triage Notes (Signed)
Patient complains of nasal congestion, runny nose, headache, loss of taste and smell, fatigue x 5 days. States that she was told to come here and get a covid test from her PCP.

## 2019-11-14 LAB — SARS CORONAVIRUS 2 (TAT 6-24 HRS): SARS Coronavirus 2: NEGATIVE

## 2019-11-19 ENCOUNTER — Telehealth: Payer: Self-pay | Admitting: Family Medicine

## 2019-11-19 MED ORDER — HYDROCODONE-HOMATROPINE 5-1.5 MG/5ML PO SYRP: 5.0000 mL | ORAL_SOLUTION | Freq: Four times a day (QID) | ORAL | 0 refills | Status: DC | PRN

## 2019-11-19 NOTE — Telephone Encounter (Signed)
Rx sent for cough medication.  Everlene Other DO [facility]

## 2019-11-27 ENCOUNTER — Ambulatory Visit
Admission: EM | Admit: 2019-11-27 | Discharge: 2019-11-27 | Disposition: A | Discharge status: Home / Self Care | Payer: BC Managed Care – PPO | Attending: Internal Medicine | Admitting: Internal Medicine

## 2019-11-27 ENCOUNTER — Encounter: Payer: Self-pay | Admitting: Emergency Medicine

## 2019-11-27 ENCOUNTER — Other Ambulatory Visit: Payer: Self-pay

## 2019-11-27 ENCOUNTER — Ambulatory Visit (INDEPENDENT_AMBULATORY_CARE_PROVIDER_SITE_OTHER): Payer: BC Managed Care – PPO

## 2019-11-27 DIAGNOSIS — R0989 Other specified symptoms and signs involving the circulatory and respiratory systems: Secondary | ICD-10-CM | POA: Insufficient documentation

## 2019-11-27 DIAGNOSIS — R05 Cough: Secondary | ICD-10-CM

## 2019-11-27 DIAGNOSIS — Z881 Allergy status to other antibiotic agents status: Secondary | ICD-10-CM | POA: Insufficient documentation

## 2019-11-27 DIAGNOSIS — R0981 Nasal congestion: Secondary | ICD-10-CM | POA: Diagnosis not present

## 2019-11-27 DIAGNOSIS — R5383 Other fatigue: Secondary | ICD-10-CM | POA: Insufficient documentation

## 2019-11-27 DIAGNOSIS — J90 Pleural effusion, not elsewhere classified: Secondary | ICD-10-CM | POA: Diagnosis not present

## 2019-11-27 DIAGNOSIS — Z9049 Acquired absence of other specified parts of digestive tract: Secondary | ICD-10-CM | POA: Diagnosis not present

## 2019-11-27 DIAGNOSIS — Z880 Allergy status to penicillin: Secondary | ICD-10-CM | POA: Diagnosis not present

## 2019-11-27 DIAGNOSIS — Z888 Allergy status to other drugs, medicaments and biological substances status: Secondary | ICD-10-CM | POA: Diagnosis not present

## 2019-11-27 DIAGNOSIS — J0140 Acute pansinusitis, unspecified: Secondary | ICD-10-CM | POA: Diagnosis not present

## 2019-11-27 DIAGNOSIS — Z793 Long term (current) use of hormonal contraceptives: Secondary | ICD-10-CM | POA: Diagnosis not present

## 2019-11-27 DIAGNOSIS — K219 Gastro-esophageal reflux disease without esophagitis: Secondary | ICD-10-CM | POA: Diagnosis not present

## 2019-11-27 DIAGNOSIS — Z7952 Long term (current) use of systemic steroids: Secondary | ICD-10-CM | POA: Insufficient documentation

## 2019-11-27 DIAGNOSIS — Z20822 Contact with and (suspected) exposure to covid-19: Secondary | ICD-10-CM | POA: Diagnosis not present

## 2019-11-27 DIAGNOSIS — F17210 Nicotine dependence, cigarettes, uncomplicated: Secondary | ICD-10-CM | POA: Diagnosis not present

## 2019-11-27 DIAGNOSIS — Z791 Long term (current) use of non-steroidal anti-inflammatories (NSAID): Secondary | ICD-10-CM | POA: Diagnosis not present

## 2019-11-27 DIAGNOSIS — Z79899 Other long term (current) drug therapy: Secondary | ICD-10-CM | POA: Diagnosis not present

## 2019-11-27 DIAGNOSIS — R059 Cough, unspecified: Secondary | ICD-10-CM

## 2019-11-27 MED ORDER — ALBUTEROL SULFATE HFA 108 (90 BASE) MCG/ACT IN AERS: 2.0000 | INHALATION_SPRAY | RESPIRATORY_TRACT | 0 refills | Status: DC | PRN

## 2019-11-27 MED ORDER — HYDROCOD POLST-CPM POLST ER 10-8 MG/5ML PO SUER
5.0000 mL | Freq: Every evening | ORAL | 0 refills | Status: DC | PRN
Start: 2019-11-27 — End: 2019-12-24

## 2019-11-27 MED ORDER — LEVOFLOXACIN 750 MG PO TABS: 750.0000 mg | ORAL_TABLET | Freq: Every day | ORAL | 0 refills | Status: DC

## 2019-11-27 MED ORDER — PREDNISONE 20 MG PO TABS: 40.0000 mg | ORAL_TABLET | Freq: Every day | ORAL | 0 refills | Status: AC

## 2019-11-27 NOTE — Discharge Instructions (Addendum)
Recommend start Levaquin 750mg  once daily for 7 days. Start Prednisone 40mg  daily for 5 days then stop. May use Albuterol 2 puffs every 4 to 6 hours as needed for wheezing/chest tightness. Continue to increase fluids to help loosen up mucus in chest. May take Tussionex 1 teaspoon at night to help with cough. Continue Tylenol 1000mg  every 8 hours as needed for pain. Follow-up pending COVID 19 test results and in 4 to 5 days if not improving.

## 2019-11-27 NOTE — ED Provider Notes (Signed)
[facility]    CSN: [id number] Arrival date & time: 11/27/19  1204      History   Chief Complaint Chief Complaint  Patient presents with  . Cough  . Nasal Congestion    HPI Leslie [name] is a 34 y.o. female.   34 year old female presents with continued nasal congestion, sinus pressure, cough, tightness in the chest and fatigue for almost 1 month. Also having decreased appetite and loss of sense of taste and smell. No vomiting but still having occasional diarrhea. Was seen on August 5th (2 weeks ago) here at Urgent Care with symptoms for about 1 week- was tested for COVID 19 and was negative. Was given Doxycycline, Tessalon cough pills, Flonase and Hycodan with minimal relief. Concerned over pneumonia and persistence of symptoms. No other family members ill. Still smokes tobacco occasionally. Other chronic health issues include GERD and takes Prilosec daily.   The history is provided by the patient.    Past Medical History:  Diagnosis Date  . Anxiety   . Back injury   . Bulging lumbar disc   . Complication of anesthesia   . Family history of adverse reaction to anesthesia    N/V-Mom  . GERD (gastroesophageal reflux disease)   . Headache    H/O  . PONV (postoperative nausea and vomiting)     Patient Active Problem List   Diagnosis Date Noted  . Encounter for tobacco use cessation counseling 09/09/2018  . Fatigue 09/09/2018  . Current moderate episode of major depressive disorder without prior episode (HCC) 09/09/2018  . Gastroesophageal reflux disease without esophagitis 09/09/2018  . Cholecystitis, chronic 02/23/2016  . Lumbosacral radiculopathy 01/07/2015    Past Surgical History:  Procedure Laterality Date  . BREAST SURGERY     augmentation  . CESAREAN SECTION  2005  . CESAREAN SECTION  2016  . CESAREAN SECTION  2005, 2016  . CHOLECYSTECTOMY N/A 02/14/2016   Procedure: LAPAROSCOPIC CHOLECYSTECTOMY WITH INTRAOPERATIVE CHOLANGIOGRAM;  Surgeon:  Earline Mayotte, MD;  Location: [facility] ORS;  Service: General;  Laterality: N/A;  . TONSILLECTOMY AND ADENOIDECTOMY  1987  . TONSILLECTOMY AND ADENOIDECTOMY  1987    OB History    Gravida  5   Para  2   Term  0   Preterm  0   AB  2   Living        SAB  1   TAB  0   Ectopic  0   Multiple      Live Births           Obstetric Comments  1st Menstrual Cycle:  10  1st Pregnancy:  17  1st Menstrual Cycle:  10 1st Pregnancy:  17          Home Medications    Prior to Admission medications   Medication Sig Start Date End Date Taking? Authorizing Provider  ibuprofen (ADVIL) 600 MG tablet Take 1 tablet (600 mg total) by mouth every 6 (six) hours as needed. 11/13/19  Yes Domenick Gong, MD  levonorgestrel (MIRENA) 20 MCG/24HR IUD 1 each by Intrauterine route once.   Yes [provider]  omeprazole (PRILOSEC) 20 MG capsule Take 1 capsule (20 mg total) by mouth 2 (two) times daily before a meal. 10/10/18  Yes Doren Custard, FNP  albuterol (VENTOLIN HFA) 108 (90 Base) MCG/ACT inhaler Inhale 2 puffs into the lungs every 4 (four) hours as needed for wheezing or shortness of breath. 11/27/19   Randall Hiss  Allyson Sabal, NP  chlorpheniramine-HYDROcodone (TUSSIONEX PENNKINETIC ER) 10-8 MG/5ML SUER Take 5 mLs by mouth at bedtime as needed for cough. 11/27/19   Sudie Grumbling, NP  levofloxacin (LEVAQUIN) 750 MG tablet Take 1 tablet (750 mg total) by mouth daily. 11/27/19   Sudie Grumbling, NP  predniSONE (DELTASONE) 20 MG tablet Take 2 tablets (40 mg total) by mouth daily for 5 days. 11/27/19 12/02/19  Sudie Grumbling, NP  fluticasone (FLONASE) 50 MCG/ACT nasal spray Place 2 sprays into both nostrils daily. 11/13/19 11/27/19  Domenick Gong, MD    Family History Family History  Problem Relation Age of Onset  . Diabetes Mother   . Thyroid disease Maternal Aunt   . Ovarian cancer Maternal Grandmother   . Heart attack Maternal Grandmother   . Cervical cancer Maternal Grandmother    . Heart attack Maternal Grandfather   . Emphysema Paternal Grandmother     Social History Social History   Tobacco Use  . Smoking status: Current Every Day Smoker    Packs/day: 0.15    Years: 15.00    Pack years: 2.25    Types: Cigarettes  . Smokeless tobacco: Never Used  Vaping Use  . Vaping Use: Never used  Substance Use Topics  . Alcohol use: Yes    Alcohol/week: 0.0 standard drinks    Comment: wine WEEKENDS  . Drug use: No    Comment: pt denies during phone interview but UDS was + for marijuana in 2016     Allergies   Amoxicillin, Bactrim [sulfamethoxazole-trimethoprim], Penicillins, and Tape   Review of Systems Review of Systems  Constitutional: Positive for activity change, appetite change and fatigue. Negative for chills and fever.  HENT: Positive for congestion, facial swelling (upper cheeks and around eyes), sinus pressure, sinus pain and sore throat (irritated). Negative for ear discharge, ear pain, mouth sores, nosebleeds, postnasal drip, rhinorrhea, sneezing and trouble swallowing.   Eyes: Negative for pain, discharge, redness and itching.  Respiratory: Positive for cough, chest tightness, shortness of breath and wheezing. Negative for stridor.   Gastrointestinal: Positive for diarrhea and nausea. Negative for vomiting.  Musculoskeletal: Positive for arthralgias, back pain and myalgias. Negative for neck pain and neck stiffness.  Skin: Negative for color change, rash and wound.  Allergic/Immunologic: Positive for environmental allergies. Negative for food allergies and immunocompromised state.  Neurological: Positive for light-headedness and headaches. Negative for dizziness, seizures, syncope, weakness and numbness.  Hematological: Negative for adenopathy. Does not bruise/bleed easily.     Physical Exam Triage Vital Signs ED Triage Vitals  Enc Vitals Group     BP 11/27/19 1310 (!) 122/92     Pulse Rate 11/27/19 1310 84     Resp 11/27/19 1310 18      Temp 11/27/19 1310 98.7 F (37.1 C)     Temp Source 11/27/19 1310 Oral     SpO2 11/27/19 1310 100 %     Weight 11/27/19 1307 122 lb (55.3 kg)     Height 11/27/19 1307 5\' 5"  (1.651 m)     Head Circumference --      Peak Flow --      Pain Score 11/27/19 1306 9     Pain Loc --      Pain Edu? --      Excl. in GC? --    No data found.  Updated Vital Signs BP (!) 122/92 (BP Location: Right Arm)   Pulse 84   Temp 98.7 F (37.1 C) (Oral)   Resp 18  Ht  (1.651 m)   Wt 122 lb (55.3 kg)   SpO2 100%   BMI 20.30 kg/m   Visual Acuity Right Eye Distance:   Left Eye Distance:   Bilateral Distance:    Right Eye Near:   Left Eye Near:    Bilateral Near:     Physical Exam Vitals and nursing note reviewed.  Constitutional:      General: She is awake. She is not in acute distress.    Appearance: She is well-developed and well-groomed. She is ill-appearing.     Comments: She is sitting comfortably on the exam table in no acute distress but appears ill and tired.   HENT:     Head: Normocephalic and atraumatic.     Right Ear: Hearing, ear canal and external ear normal. Tympanic membrane is bulging. Tympanic membrane is not injected, perforated or erythematous.     Left Ear: Hearing, ear canal and external ear normal. Tympanic membrane is bulging. Tympanic membrane is not injected, perforated or erythematous.     Nose: Mucosal edema and congestion present.     Right Turbinates: Enlarged.     Left Turbinates: Enlarged.     Right Sinus: Maxillary sinus tenderness and frontal sinus tenderness present.     Left Sinus: Maxillary sinus tenderness and frontal sinus tenderness present.     Mouth/Throat:     Lips: Pink.     Mouth: Mucous membranes are moist.     Pharynx: Uvula midline. Posterior oropharyngeal erythema present. No pharyngeal swelling, oropharyngeal exudate or uvula swelling.  Eyes:     Extraocular Movements: Extraocular movements intact.     Conjunctiva/sclera:  Conjunctivae normal.  Cardiovascular:     Rate and Rhythm: Normal rate and regular rhythm.     Heart sounds: Normal heart sounds. No murmur heard.   Pulmonary:     Effort: Pulmonary effort is normal. No accessory muscle usage, respiratory distress or retractions.     Breath sounds: Normal air entry. No decreased air movement. Examination of the right-upper field reveals decreased breath sounds, wheezing and rhonchi. Examination of the left-upper field reveals decreased breath sounds, wheezing and rhonchi. Examination of the right-middle field reveals decreased breath sounds and wheezing. Examination of the right-lower field reveals decreased breath sounds and wheezing. Examination of the left-lower field reveals decreased breath sounds and wheezing. Decreased breath sounds, wheezing and rhonchi present. No rales.  Musculoskeletal:        General: Normal range of motion.     Cervical back: Normal range of motion and neck supple. No rigidity.  Lymphadenopathy:     Cervical: No cervical adenopathy.  Skin:    General: Skin is warm and dry.     Capillary Refill: Capillary refill takes less than 2 seconds.     Findings: No rash.  Neurological:     General: No focal deficit present.     Mental Status: She is alert and oriented to person, place, and time.  Psychiatric:        Mood and Affect: Mood normal.        Behavior: Behavior normal. Behavior is cooperative.        Thought Content: Thought content normal.        Judgment: Judgment normal.      UC Treatments / Results  Labs (all labs ordered are listed, but only abnormal results are displayed) Labs Reviewed  SARS CORONAVIRUS 2 (TAT 6-24 HRS)    EKG   Radiology DG Chest 2 View  Result Date: 11/27/2019 CLINICAL DATA:  Cough and congestion. EXAM: CHEST - 2 VIEW COMPARISON:  None. FINDINGS: The chest is hyperexpanded. Lungs clear. Heart size normal. No pneumothorax or pleural effusion. No bony abnormality. IMPRESSION: No acute  disease.  Hyperexpansion noted. Electronically Signed   By: Drusilla Kanner M.D.   On: 11/27/2019 13:28    Procedures Procedures (including critical care time)  Medications Ordered in UC Medications - No data to display  Initial Impression / Assessment and Plan / UC Course  I have reviewed the triage vital signs and the nursing notes.  Pertinent labs & imaging results that were available during my care of the patient were reviewed by me and considered in my medical decision making (see chart for details).    Reviewed chest x-ray results with patient- hyperexpansion of lungs similar to finding with asthma/reactive airway disease. No pneumonia or pleural effusion. Discussed that she appears to have a sinus infection- due to length of symptoms- probably bacterial. Since allergic to PCN and recently on Doxycycline, will trial Levaquin 750mg  once daily for 7 days. Due to inflammation and wheezing on exam, will start Prednisone 40mg  daily for 5 days. May use Albuterol inhaler 2 puffs every 4 to 6 hours as needed. May continue Tylenol as needed for body aches and headache. May also use Tussionex 1 teaspoon at night to help with cough. Continue to increase fluid intake to help loosen up mucus in sinuses and chest. May need ENT consult if symptoms do not improve within 5 to 7 days. Recommend follow-up pending COVID 19 test results and in 4 to 5 days if minimal improvement.  Final Clinical Impressions(s) / UC Diagnoses   Final diagnoses:  Acute non-recurrent pansinusitis  Cough  Hyperinflation of lungs  Fatigue, unspecified type     Discharge Instructions     Recommend start Levaquin 750mg  once daily for 7 days. Start Prednisone 40mg  daily for 5 days then stop. May use Albuterol 2 puffs every 4 to 6 hours as needed for wheezing/chest tightness. Continue to increase fluids to help loosen up mucus in chest. May take Tussionex 1 teaspoon at night to help with cough. Continue Tylenol 1000mg  every 8  hours as needed for pain. Follow-up pending COVID 19 test results and in 4 to 5 days if not improving.     ED Prescriptions    Medication Sig Dispense Auth. Provider   levofloxacin (LEVAQUIN) 750 MG tablet Take 1 tablet (750 mg total) by mouth daily. 7 tablet , NP   predniSONE (DELTASONE) 20 MG tablet Take 2 tablets (40 mg total) by mouth daily for 5 days. 10 tablet , NP   albuterol (VENTOLIN HFA) 108 (90 Base) MCG/ACT inhaler Inhale 2 puffs into the lungs every 4 (four) hours as needed for wheezing or shortness of breath. 18 g , NP   chlorpheniramine-HYDROcodone [facility] ER) 10-8 MG/5ML SUER Take 5 mLs by mouth at bedtime as needed for cough. 70 mL Justice Aguirre, , NP     PDMP WAS reviewed this encounter. Only recent controlled medication was Tramadol in June 2021. No other medications listed. I feel the benefits outweigh the risks of prescribing a controlled cough medication at this time.    Sudie Grumbling, NP 11/27/19 1750

## 2019-11-27 NOTE — ED Triage Notes (Signed)
Patient c/o cough, nasal congestion, mild shortness of breath that started almost one month ago. She did take all of her medications that were prescribed to her on Aug 5th with no relief at all. She still has no taste or smell.

## 2019-11-28 LAB — SARS CORONAVIRUS 2 (TAT 6-24 HRS): SARS Coronavirus 2: NEGATIVE

## 2019-12-22 NOTE — Progress Notes (Signed)
Patient: Leslie [name], Female    DOB: January 01, 1986, 34 y.o.   MRN: [id number] Delsa Grana, PA-C Visit Date: 12/23/2019  Today's Provider: Delsa Grana, PA-C   Chief Complaint  Patient presents with  . Annual Exam   Subjective:   Annual physical exam:  Leslie [name] is a 34 y.o. female who presents today for complete physical exam:  Exercise/Activity: Not much exercise currently Diet/nutrition:  Average - no specific diet/nutrition   Current smoker, cough x 1 month - neg COVID test x 2, CXR done about a month ago shows hyperinflation and no other acute cardiopulmonary pathology -very wheezy on exam patient has had persistent cough often very productive, associated with wheeze and chest tightness no past history of asthma or COPD but she does have a history of smoking   USPSTF grade A and B recommendations - reviewed and addressed today  Depression:  Phq 9 completed today by patient, was reviewed by me with patient in the room PHQ score is neg, pt feels good PHQ 2/9 Scores 12/23/2019 11/12/2019 04/30/2019 10/10/2018  PHQ - 2 Score 0 0 0 0  PHQ- 9 Score - 0 0 0   Depression screen [facility] 2/9 12/23/2019 11/12/2019 04/30/2019 10/10/2018 09/09/2018  Decreased Interest 0 0 0 0 3  Down, Depressed, Hopeless 0 0 0 0 0  PHQ - 2 Score 0 0 0 0 3  Altered sleeping - 0 0 0 3  Tired, decreased energy - 0 0 0 3  Change in appetite - 0 0 0 3  Feeling bad or failure about yourself  - 0 0 0 1  Trouble concentrating - 0 0 0 2  Moving slowly or fidgety/restless - 0 0 0 1  Suicidal thoughts - 0 0 0 0  PHQ-9 Score - 0 0 0 16  Difficult doing work/chores - Not difficult at all Not difficult at all Not difficult at all Somewhat difficult    Alcohol screening:   Office Visit from 12/23/2019 in [facility]  AUDIT-C Score 1      Immunizations and Health Maintenance: Health Maintenance  Topic Date Due  . COVID-19 Vaccine (1) Never done  . INFLUENZA VACCINE  Never done  . PAP-Cervical  Cytology Screening  10/09/2021  . PAP SMEAR-Modifier  10/09/2021  . TETANUS/TDAP  05/17/2027  . Hepatitis C Screening  Completed  . HIV Screening  Completed     Hep C Screening: 09/09/18  STD testing and prevention (HIV/chl/gon/syphilis):09/09/18  see above, no additional testing desired by pt today  Intimate partner violence: safe  Sexual History/Pain during Intercourse: Married  Menstrual History/LMP/Abnormal Bleeding: none, denies, rare spotting  No LMP recorded. (Menstrual status: IUD).  Incontinence Symptoms:  none  Breast cancer:   Not screening yet due to age Last Mammogram: *n/a BRCA gene screening: none known   Cervical cancer screening: utd Pt moms mom had ovarian CA - no other known family cancers - breast, ovarian, uterine, colon:       Osteoporosis:   Discussion on osteoporosis per age, including high calcium and vitamin D supplementation, weight bearing exercises N/a per age  Skin cancer:  Hx of skin CA -  N Discussed atypical lesions   Colorectal cancer:   Colonoscopy is n/a Discussed concerning signs and sx of CRC, pt denies melena, hematochezia, change in BM pattern or caliber  Lung cancer:  N/a Low Dose CT Chest recommended if Age 19-80 years, 30 pack-year currently smoking OR have quit w/in 15years.  Patient does not qualify.    Social History   Tobacco Use  . Smoking status: Current Every Day Smoker    Packs/day: 0.15    Years: 15.00    Pack years: 2.25    Types: Cigarettes  . Smokeless tobacco: Never Used  Vaping Use  . Vaping Use: Never used  Substance Use Topics  . Alcohol use: Yes    Alcohol/week: 0.0 standard drinks    Comment: wine WEEKENDS  . Drug use: No    Comment: pt denies during phone interview but UDS was + for marijuana in 2016       Office Visit from 12/23/2019 in [facility]  AUDIT-C Score 1      Family History  Problem Relation Age of Onset  . Diabetes Mother   . Thyroid disease Maternal Aunt   .  Ovarian cancer Maternal Grandmother   . Heart attack Maternal Grandmother   . Cervical cancer Maternal Grandmother   . Heart attack Maternal Grandfather   . Emphysema Paternal Grandmother      Blood pressure/Hypertension: BP Readings from Last 3 Encounters:  12/23/19 120/78  11/27/19 (!) 122/92  11/13/19 (!) 135/95    Weight/Obesity: Wt Readings from Last 3 Encounters:  12/23/19 122 lb (55.3 kg)  11/27/19 122 lb (55.3 kg)  11/13/19 128 lb (58.1 kg)   BMI Readings from Last 3 Encounters:  12/23/19 20.30 kg/m  11/27/19 20.30 kg/m  11/13/19 21.30 kg/m     Lipids:  Lab Results  Component Value Date   CHOL 189 09/09/2018   Lab Results  Component Value Date   HDL 67 09/09/2018   Lab Results  Component Value Date   LDLCALC 106 (H) 09/09/2018   Lab Results  Component Value Date   TRIG 73 09/09/2018   Lab Results  Component Value Date   CHOLHDL 2.8 09/09/2018   No results found for: LDLDIRECT Based on the results of lipid panel his/her cardiovascular risk factor ( using [place] )  in the next 10 years is: The ASCVD Risk score Mikey Bussing DC Jr., et al., 2013) failed to calculate for the following reasons:   The 2013 ASCVD risk score is only valid for ages 20 to 72 Glucose:  Glucose  Date Value Ref Range Status  01/09/2014 81 65 - 99 mg/dL Final  05/07/2013 93 65 - 99 mg/dL Final  04/11/2013 74 65 - 99 mg/dL Final   Glucose, Bld  Date Value Ref Range Status  09/09/2018 92 65 - 99 mg/dL Final    Comment:    .            Fasting reference interval .    Hypertension: BP Readings from Last 3 Encounters:  12/23/19 120/78  11/27/19 (!) 122/92  11/13/19 (!) 135/95   Obesity: Wt Readings from Last 3 Encounters:  12/23/19 122 lb (55.3 kg)  11/27/19 122 lb (55.3 kg)  11/13/19 128 lb (58.1 kg)   BMI Readings from Last 3 Encounters:  12/23/19 20.30 kg/m  11/27/19 20.30 kg/m  11/13/19 21.30 kg/m      Advanced Care Planning:  A voluntary discussion  about advance care planning including the explanation and discussion of advance directives.    Social History      She        Social History   Socioeconomic History  . Marital status: Married    Spouse name: Metro Kung  . Number of children: 2  . Years of education: 43  . Highest  education level: 11th grade  Occupational History  . Not on file  Tobacco Use  . Smoking status: Current Every Day Smoker    Packs/day: 0.15    Years: 15.00    Pack years: 2.25    Types: Cigarettes  . Smokeless tobacco: Never Used  Vaping Use  . Vaping Use: Never used  Substance and Sexual Activity  . Alcohol use: Yes    Alcohol/week: 0.0 standard drinks    Comment: wine WEEKENDS  . Drug use: No    Comment: pt denies during phone interview but UDS was + for marijuana in 2016  . Sexual activity: Yes  Other Topics Concern  . Not on file  Social History Narrative   ** Merged History Encounter **       Social Determinants of Health   Financial Resource Strain:   . Difficulty of Paying Living Expenses: Not on file  Food Insecurity:   . Worried About Charity fundraiser in the Last Year: Not on file  . Ran Out of Food in the Last Year: Not on file  Transportation Needs:   . Lack of Transportation (Medical): Not on file  . Lack of Transportation (Non-Medical): Not on file  Physical Activity:   . Days of Exercise per Week: Not on file  . Minutes of Exercise per Session: Not on file  Stress:   . Feeling of Stress : Not on file  Social Connections:   . Frequency of Communication with Friends and Family: Not on file  . Frequency of Social Gatherings with Friends and Family: Not on file  . Attends Religious Services: Not on file  . Active Member of Clubs or Organizations: Not on file  . Attends Archivist Meetings: Not on file  . Marital Status: Not on file    Family History        Family History  Problem Relation Age of Onset  . Diabetes Mother   . Thyroid disease Maternal Aunt     . Ovarian cancer Maternal Grandmother   . Heart attack Maternal Grandmother   . Cervical cancer Maternal Grandmother   . Heart attack Maternal Grandfather   . Emphysema Paternal Grandmother     Patient Active Problem List   Diagnosis Date Noted  . Encounter for tobacco use cessation counseling 09/09/2018  . Fatigue 09/09/2018  . Current moderate episode of major depressive disorder without prior episode ([place]) 09/09/2018  . Gastroesophageal reflux disease without esophagitis 09/09/2018  . Cholecystitis, chronic 02/23/2016  . Lumbosacral radiculopathy 01/07/2015    Past Surgical History:  Procedure Laterality Date  . BREAST SURGERY     augmentation  . CESAREAN SECTION  2005  . CESAREAN SECTION  2016  . CESAREAN SECTION  2005, 2016  . CHOLECYSTECTOMY N/A 02/14/2016   Procedure: LAPAROSCOPIC CHOLECYSTECTOMY WITH INTRAOPERATIVE CHOLANGIOGRAM;  Surgeon: Robert Bellow, MD;  Location: [facility] ORS;  Service: General;  Laterality: N/A;  . TONSILLECTOMY AND ADENOIDECTOMY  1987  . TONSILLECTOMY AND ADENOIDECTOMY  1987     Current Outpatient Medications:  .  albuterol (VENTOLIN HFA) 108 (90 Base) MCG/ACT inhaler, Inhale 2 puffs into the lungs every 4 (four) hours as needed for wheezing or shortness of breath., Disp: 18 g, Rfl: 0 .  levonorgestrel (MIRENA) 20 MCG/24HR IUD, 1 each by Intrauterine route once., Disp: , Rfl:  .  omeprazole (PRILOSEC) 20 MG capsule, Take 1 capsule (20 mg total) by mouth 2 (two) times daily before a meal., Disp: 60 capsule, Rfl:  1 .  chlorpheniramine-HYDROcodone (TUSSIONEX PENNKINETIC ER) 10-8 MG/5ML SUER, Take 5 mLs by mouth at bedtime as needed for cough. (Patient not taking: Reported on 12/23/2019), Disp: 70 mL, Rfl: 0 .  ibuprofen (ADVIL) 600 MG tablet, Take 1 tablet (600 mg total) by mouth every 6 (six) hours as needed. (Patient not taking: Reported on 12/23/2019), Disp: 30 tablet, Rfl: 0 .  levofloxacin (LEVAQUIN) 750 MG tablet, Take 1 tablet (750 mg total) by  mouth daily. (Patient not taking: Reported on 12/23/2019), Disp: 7 tablet, Rfl: 0  Allergies  Allergen Reactions  . Amoxicillin Hives    Has patient had a PCN reaction causing immediate rash, facial/tongue/throat swelling, SOB or lightheadedness with hypotension: {no Has patient had a PCN reaction causing severe rash involving mucus membranes or skin necrosis: no Has patient had a PCN reaction that required hospitalization no Has patient had a PCN reaction occurring within the last 10 years: no If all of the above answers are "NO", then may proceed with Cephalosporin use.  . Bactrim [Sulfamethoxazole-Trimethoprim] Hives  . Penicillins Hives    Has patient had a PCN reaction causing immediate rash, facial/tongue/throat swelling, SOB or lightheadedness with hypotension: {no Has patient had a PCN reaction causing severe rash involving mucus membranes or skin necrosis: {no Has patient had a PCN reaction that required hospitalization no Has patient had a PCN reaction occurring within the last 10 years: {no If all of the above answers are "NO", then may proceed with Cephalosporin use.  . Tape Rash    PT TOLERATES PAPER TAPE WELL    Patient Care Team: Delsa Grana, PA-C as PCP - General (Family Medicine) Nathaneil Canary, PA-C (Physician Assistant) Audie Pinto Shari Heritage, MD as Referring Physician (Family Medicine) Bary Castilla Forest Gleason, MD (General Surgery) Polanco, Shari Heritage, MD as Referring Physician (Family Medicine) Bary Castilla Forest Gleason, MD (General Surgery)  Review of Systems  Constitutional: Negative.  Negative for activity change, appetite change, fatigue, fever and unexpected weight change.  HENT: Negative.   Eyes: Negative.   Respiratory: Positive for cough, chest tightness, shortness of breath and wheezing. Negative for choking and stridor.   Cardiovascular: Negative.  Negative for chest pain, palpitations and leg swelling.  Gastrointestinal: Negative.  Negative for abdominal pain  and blood in stool.  Endocrine: Negative.   Genitourinary: Negative.   Musculoskeletal: Negative.  Negative for arthralgias, gait problem, joint swelling and myalgias.  Skin: Negative.  Negative for pallor and rash.  Allergic/Immunologic: Negative.   Neurological: Negative.  Negative for syncope and weakness.  Hematological: Negative.   Psychiatric/Behavioral: Negative.  Negative for dysphoric mood, self-injury and suicidal ideas. The patient is not nervous/anxious.   All other systems reviewed and are negative.    I personally reviewed active problem list, medication list, allergies, family history, social history, health maintenance, notes from last encounter, lab results, imaging with the patient/caregiver today.        Objective:   Vitals:  Vitals:   12/23/19 1042  BP: 120/78  Pulse: 84  Resp: 18  Temp: 98.3 F (36.8 C)  TempSrc: Oral  SpO2: 95%  Weight: 122 lb (55.3 kg)  Height: '5\' 5"'  (1.651 m)    Body mass index is 20.3 kg/m.  Physical Exam Vitals and nursing note reviewed.  Constitutional:      General: She is not in acute distress.    Appearance: Normal appearance. She is well-developed and normal weight. She is not ill-appearing, toxic-appearing or diaphoretic.     Interventions: Face mask in  place.  HENT:     Head: Normocephalic and atraumatic.     Right Ear: External ear normal.     Left Ear: External ear normal.     Nose: Nose normal.     Mouth/Throat:     Mouth: Mucous membranes are moist.  Eyes:     General: Lids are normal. No scleral icterus.       Right eye: No discharge.        Left eye: No discharge.     Conjunctiva/sclera: Conjunctivae normal.     Pupils: Pupils are equal, round, and reactive to light.  Neck:     Thyroid: No thyroid mass, thyromegaly or thyroid tenderness.     Trachea: Trachea and phonation normal. No tracheal deviation.  Cardiovascular:     Rate and Rhythm: Normal rate and regular rhythm.     Pulses: Normal pulses.           Radial pulses are 2+ on the right side and 2+ on the left side.       Posterior tibial pulses are 2+ on the right side and 2+ on the left side.     Heart sounds: Normal heart sounds. No murmur heard.  No friction rub. No gallop.   Pulmonary:     Effort: Pulmonary effort is normal. No respiratory distress.     Breath sounds: No stridor. Wheezing and rhonchi present. No rales.     Comments: Frequent coughing Chest:     Chest wall: No tenderness.  Abdominal:     General: Bowel sounds are normal. There is no distension.     Palpations: Abdomen is soft.     Tenderness: There is no abdominal tenderness. There is no right CVA tenderness, left CVA tenderness or guarding.  Musculoskeletal:     Cervical back: Normal range of motion.     Right lower leg: No edema.     Left lower leg: No edema.  Skin:    General: Skin is warm and dry.     Capillary Refill: Capillary refill takes less than 2 seconds.     Coloration: Skin is not jaundiced or pale.     Findings: No rash.  Neurological:     Mental Status: She is alert.     Motor: No abnormal muscle tone.     Gait: Gait normal.  Psychiatric:        Mood and Affect: Mood normal.        Speech: Speech normal.        Behavior: Behavior normal.       Fall Risk: Fall Risk  12/23/2019 11/12/2019 04/30/2019 10/10/2018 09/09/2018  Falls in the past year? 1 1 0 0 0  Number falls in past yr: 1 0 0 0 0  Injury with Fall? 1 1 0 0 0  Follow up Falls evaluation completed - Falls evaluation completed - -    Functional Status Survey: Is the patient deaf or have difficulty hearing?: No Does the patient have difficulty seeing, even when wearing glasses/contacts?: No Does the patient have difficulty concentrating, remembering, or making decisions?: No Does the patient have difficulty walking or climbing stairs?: No Does the patient have difficulty dressing or bathing?: No Does the patient have difficulty doing errands alone such as visiting a doctor's  office or shopping?: No   Assessment & Plan:    CPE completed today  . USPSTF grade A and B recommendations reviewed with patient; age-appropriate recommendations, preventive care, screening tests, etc discussed and  encouraged; healthy living encouraged; see AVS for patient education given to patient  . Discussed importance of 150 minutes of physical activity weekly, AHA exercise recommendations given to pt in AVS/handout  . Discussed importance of healthy diet:  eating lean meats and proteins, avoiding trans fats and saturated fats, avoid simple sugars and excessive carbs in diet, eat 6 servings of fruit/vegetables daily and drink plenty of water and avoid sweet beverages.    . Recommended pt to do annual eye exam and routine dental exams/cleanings  . Depression, alcohol, fall screening completed as documented above and per flowsheets  . Reviewed Health Maintenance: Health Maintenance  Topic Date Due  . COVID-19 Vaccine (1) Never done  . INFLUENZA VACCINE  Never done  . PAP-Cervical Cytology Screening  10/09/2021  . PAP SMEAR-Modifier  10/09/2021  . TETANUS/TDAP  05/17/2027  . Hepatitis C Screening  Completed  . HIV Screening  Completed    . Immunizations: Immunization History  Administered Date(s) Administered  . Tdap 11/02/2015, 05/16/2017    1. Annual physical exam - Lipid panel - COMPLETE METABOLIC PANEL WITH GFR - CBC w/Diff/Platelet - TSH  2. Family history of thyroid disease - TSH  3. Current smoker Encouraged complete smoking cessation  4. Hyperlipidemia, unspecified hyperlipidemia type Not currently on medications, recheck labs today discussed diet lifestyle efforts for high cholesterol - Lipid panel - COMPLETE METABOLIC PANEL WITH GFR  5. Acute bronchitis, unspecified organism Cough with wheeze that has been ongoing for more than a month very frequent coughing fits intermittently productive, very diffusely wheezy on exam today may be a prolonged  bronchitis, she has had multiple rounds of antibiotics in the past 1 to 2 months She does have some signs of nasal allergies and drainage encouraged her to use over-the-counter antihistamines, steroid nose sprays, gave her a prednisone taper, DuoNebs (she has a nebulizer at home for one of her children who has asthma gave her tubing and mask for herself today while in clinic) Z-Pak added due to persistent pulmonary symptoms over 1-2 months  Family hx of DM - add on A1C if blood glucose elevated  Delsa Grana, PA-C 12/23/19 10:55 AM  [place]

## 2019-12-23 ENCOUNTER — Ambulatory Visit (INDEPENDENT_AMBULATORY_CARE_PROVIDER_SITE_OTHER): Payer: BC Managed Care – PPO | Admitting: Family Medicine

## 2019-12-23 ENCOUNTER — Encounter: Payer: Self-pay | Admitting: Internal Medicine

## 2019-12-23 ENCOUNTER — Encounter: Payer: Self-pay | Admitting: Family Medicine

## 2019-12-23 ENCOUNTER — Other Ambulatory Visit: Payer: Self-pay

## 2019-12-23 VITALS — BP 120/78 | HR 84 | Temp 98.3°F | Resp 18 | Ht 65.0 in | Wt 122.0 lb

## 2019-12-23 DIAGNOSIS — E785 Hyperlipidemia, unspecified: Secondary | ICD-10-CM

## 2019-12-23 DIAGNOSIS — Z8349 Family history of other endocrine, nutritional and metabolic diseases: Secondary | ICD-10-CM | POA: Diagnosis not present

## 2019-12-23 DIAGNOSIS — Z0000 Encounter for general adult medical examination without abnormal findings: Secondary | ICD-10-CM

## 2019-12-23 DIAGNOSIS — F17200 Nicotine dependence, unspecified, uncomplicated: Secondary | ICD-10-CM | POA: Diagnosis not present

## 2019-12-23 DIAGNOSIS — J209 Acute bronchitis, unspecified: Secondary | ICD-10-CM

## 2019-12-23 DIAGNOSIS — Z833 Family history of diabetes mellitus: Secondary | ICD-10-CM

## 2019-12-23 MED ORDER — IPRATROPIUM-ALBUTEROL 0.5-2.5 (3) MG/3ML IN SOLN: 3.0000 mL | Freq: Four times a day (QID) | RESPIRATORY_TRACT | 2 refills | Status: DC | PRN

## 2019-12-23 MED ORDER — AZITHROMYCIN 250 MG PO TABS: 250.0000 mg | ORAL_TABLET | Freq: Every day | ORAL | 0 refills | Status: DC

## 2019-12-23 MED ORDER — ZYRTEC ALLERGY 10 MG PO TBDP: 10.0000 mg | ORAL_TABLET | Freq: Every day | ORAL | 0 refills | Status: DC

## 2019-12-23 MED ORDER — FLUTICASONE PROPIONATE 50 MCG/ACT NA SUSP: 2.0000 | Freq: Every day | NASAL | 2 refills | Status: DC

## 2019-12-23 MED ORDER — PREDNISONE 20 MG PO TABS: ORAL_TABLET | ORAL | 0 refills | Status: DC

## 2019-12-23 NOTE — Patient Instructions (Signed)
Preventive Care 21-36 Years Old, Female Preventive care refers to visits with your health care provider and lifestyle choices that can promote health and wellness. This includes:  A yearly physical exam. This may also be called an annual well check.  Regular dental visits and eye exams.  Immunizations.  Screening for certain conditions.  Healthy lifestyle choices, such as eating a healthy diet, getting regular exercise, not using drugs or products that contain nicotine and tobacco, and limiting alcohol use. What can I expect for my preventive care visit? Physical exam Your health care provider will check your:  Height and weight. This may be used to calculate body mass index (BMI), which tells if you are at a healthy weight.  Heart rate and blood pressure.  Skin for abnormal spots. Counseling Your health care provider may ask you questions about your:  Alcohol, tobacco, and drug use.  Emotional well-being.  Home and relationship well-being.  Sexual activity.  Eating habits.  Work and work environment.  Method of birth control.  Menstrual cycle.  Pregnancy history. What immunizations do I need?  Influenza (flu) vaccine  This is recommended every year. Tetanus, diphtheria, and pertussis (Tdap) vaccine  You may need a Td booster every 10 years. Varicella (chickenpox) vaccine  You may need this if you have not been vaccinated. Human papillomavirus (HPV) vaccine  If recommended by your health care provider, you may need three doses over 6 months. Measles, mumps, and rubella (MMR) vaccine  You may need at least one dose of MMR. You may also need a second dose. Meningococcal conjugate (MenACWY) vaccine  One dose is recommended if you are age 19-21 years and a first-year college student living in a residence hall, or if you have one of several medical conditions. You may also need additional booster doses. Pneumococcal conjugate (PCV13) vaccine  You may need  this if you have certain conditions and were not previously vaccinated. Pneumococcal polysaccharide (PPSV23) vaccine  You may need one or two doses if you smoke cigarettes or if you have certain conditions. Hepatitis A vaccine  You may need this if you have certain conditions or if you travel or work in places where you may be exposed to hepatitis A. Hepatitis B vaccine  You may need this if you have certain conditions or if you travel or work in places where you may be exposed to hepatitis B. Haemophilus influenzae type b (Hib) vaccine  You may need this if you have certain conditions. You may receive vaccines as individual doses or as more than one vaccine together in one shot (combination vaccines). Talk with your health care provider about the risks and benefits of combination vaccines. What tests do I need?  Blood tests  Lipid and cholesterol levels. These may be checked every 5 years starting at age 20.  Hepatitis C test.  Hepatitis B test. Screening  Diabetes screening. This is done by checking your blood sugar (glucose) after you have not eaten for a while (fasting).  Sexually transmitted disease (STD) testing.  BRCA-related cancer screening. This may be done if you have a family history of breast, ovarian, tubal, or peritoneal cancers.  Pelvic exam and Pap test. This may be done every 3 years starting at age 21. Starting at age 30, this may be done every 5 years if you have a Pap test in combination with an HPV test. Talk with your health care provider about your test results, treatment options, and if necessary, the need for more tests.   Follow these instructions at home: Eating and drinking   Eat a diet that includes fresh fruits and vegetables, whole grains, lean protein, and low-fat dairy.  Take vitamin and mineral supplements as recommended by your health care provider.  Do not drink alcohol if: ? Your health care provider tells you not to drink. ? You are  pregnant, may be pregnant, or are planning to become pregnant.  If you drink alcohol: ? Limit how much you have to 0-1 drink a day. ? Be aware of how much alcohol is in your drink. In the [place] one drink equals one 12 oz bottle of beer (355 mL), one 5 oz glass of wine (148 mL), or one 1 oz glass of hard liquor (44 mL). Lifestyle  Take daily care of your teeth and gums.  Stay active. Exercise for at least 30 minutes on 5 or more days each week.  Do not use any products that contain nicotine or tobacco, such as cigarettes, e-cigarettes, and chewing tobacco. If you need help quitting, ask your health care provider.  If you are sexually active, practice safe sex. Use a condom or other form of birth control (contraception) in order to prevent pregnancy and STIs (sexually transmitted infections). If you plan to become pregnant, see your health care provider for a preconception visit. What's next?  Visit your health care provider once a year for a well check visit.  Ask your health care provider how often you should have your eyes and teeth checked.  Stay up to date on all vaccines. This information is not intended to replace advice given to you by your health care provider. Make sure you discuss any questions you have with your health care provider. Document Revised: 12/06/2017 Document Reviewed: 12/06/2017 Elsevier Patient Education  [place].    Cough, Adult A cough helps to clear your throat and lungs. A cough may be a sign of an illness or another medical condition. An acute cough may only last 2-3 weeks, while a chronic cough may last 8 or more weeks. Many things can cause a cough. They include:  Germs (viruses or bacteria) that attack the airway.  Breathing in things that bother (irritate) your lungs.  Allergies.  Asthma.  Mucus that runs down the back of your throat (postnasal drip).  Smoking.  Acid backing up from the stomach into the tube that moves food from  the mouth to the stomach (gastroesophageal reflux).  Some medicines.  Lung problems.  Other medical conditions, such as heart failure or a blood clot in the lung (pulmonary embolism). Follow these instructions at home: Medicines  Take over-the-counter and prescription medicines only as told by your doctor.  Talk with your doctor before you take medicines that stop a cough (coughsuppressants). Lifestyle   Do not smoke, and try not to be around smoke. Do not use any products that contain nicotine or tobacco, such as cigarettes, e-cigarettes, and chewing tobacco. If you need help quitting, ask your doctor.  Drink enough fluid to keep your pee (urine) pale yellow.  Avoid caffeine.  Do not drink alcohol if your doctor tells you not to drink. General instructions   Watch for any changes in your cough. Tell your doctor about them.  Always cover your mouth when you cough.  Stay away from things that make you cough, such as perfume, candles, campfire smoke, or cleaning products.  If the air is dry, use a cool mist vaporizer or humidifier in your home.  If your  cough is worse at night, try using extra pillows to raise your head up higher while you sleep.  Rest as needed.  Keep all follow-up visits as told by your doctor. This is important. Contact a doctor if:  You have new symptoms.  You cough up pus.  Your cough does not get better after 2-3 weeks, or your cough gets worse.  Cough medicine does not help your cough and you are not sleeping well.  You have pain that gets worse or pain that is not helped with medicine.  You have a fever.  You are losing weight and you do not know why.  You have night sweats. Get help right away if:  You cough up blood.  You have trouble breathing.  Your heartbeat is very fast. These symptoms may be an emergency. Do not wait to see if the symptoms will go away. Get medical help right away. Call your local emergency services (911 in  the [place] Do not drive yourself to the hospital. Summary  A cough helps to clear your throat and lungs. Many things can cause a cough.  Take over-the-counter and prescription medicines only as told by your doctor.  Always cover your mouth when you cough.  Contact a doctor if you have new symptoms or you have a cough that does not get better or gets worse. This information is not intended to replace advice given to you by your health care provider. Make sure you discuss any questions you have with your health care provider. Document Revised: 04/15/2018 Document Reviewed: 04/15/2018 Elsevier Patient Education  [place].   Steps to Quit Smoking Smoking tobacco is the leading cause of preventable death. It can affect almost every organ in the body. Smoking puts you and people around you at risk for many serious, long-lasting (chronic) diseases. Quitting smoking can be hard, but it is one of the best things that you can do for your health. It is never too late to quit. How do I get ready to quit? When you decide to quit smoking, make a plan to help you succeed. Before you quit:  Pick a date to quit. Set a date within the next 2 weeks to give you time to prepare.  Write down the reasons why you are quitting. Keep this list in places where you will see it often.  Tell your family, friends, and co-workers that you are quitting. Their support is important.  Talk with your doctor about the choices that may help you quit.  Find out if your health insurance will pay for these treatments.  Know the people, places, things, and activities that make you want to smoke (triggers). Avoid them. What first steps can I take to quit smoking?  Throw away all cigarettes at home, at work, and in your car.  Throw away the things that you use when you smoke, such as ashtrays and lighters.  Clean your car. Make sure to empty the ashtray.  Clean your home, including curtains and carpets. What  can I do to help me quit smoking? Talk with your doctor about taking medicines and seeing a counselor at the same time. You are more likely to succeed when you do both.  If you are pregnant or breastfeeding, talk with your doctor about counseling or other ways to quit smoking. Do not take medicine to help you quit smoking unless your doctor tells you to do so. To quit smoking: Quit right away  Quit smoking totally, instead of  slowly cutting back on how much you smoke over a period of time.  Go to counseling. You are more likely to quit if you go to counseling sessions regularly. Take medicine You may take medicines to help you quit. Some medicines need a prescription, and some you can buy over-the-counter. Some medicines may contain a drug called nicotine to replace the nicotine in cigarettes. Medicines may:  Help you to stop having the desire to smoke (cravings).  Help to stop the problems that come when you stop smoking (withdrawal symptoms). Your doctor may ask you to use:  Nicotine patches, gum, or lozenges.  Nicotine inhalers or sprays.  Non-nicotine medicine that is taken by mouth. Find resources Find resources and other ways to help you quit smoking and remain smoke-free after you quit. These resources are most helpful when you use them often. They include:  Online chats with a Social worker.  Phone quitlines.  Printed Furniture conservator/restorer.  Support groups or group counseling.  Text messaging programs.  Mobile phone apps. Use apps on your mobile phone or tablet that can help you stick to your quit plan. There are many free apps for mobile phones and tablets as well as websites. Examples include Quit Guide from the [employer] and smokefree.gov  What things can I do to make it easier to quit?   Talk to your family and friends. Ask them to support and encourage you.  Call a phone quitline (1-800-QUIT-NOW), reach out to support groups, or work with a Social worker.  Ask people who  smoke to not smoke around you.  Avoid places that make you want to smoke, such as: ? Bars. ? Parties. ? Smoke-break areas at work.  Spend time with people who do not smoke.  Lower the stress in your life. Stress can make you want to smoke. Try these things to help your stress: ? Getting regular exercise. ? Doing deep-breathing exercises. ? Doing yoga. ? Meditating. ? Doing a body scan. To do this, close your eyes, focus on one area of your body at a time from head to toe. Notice which parts of your body are tense. Try to relax the muscles in those areas. How will I feel when I quit smoking? Day 1 to 3 weeks Within the first 24 hours, you may start to have some problems that come from quitting tobacco. These problems are very bad 2-3 days after you quit, but they do not often last for more than 2-3 weeks. You may get these symptoms:  Mood swings.  Feeling restless, nervous, angry, or annoyed.  Trouble concentrating.  Dizziness.  Strong desire for high-sugar foods and nicotine.  Weight gain.  Trouble pooping (constipation).  Feeling like you may vomit (nausea).  Coughing or a sore throat.  Changes in how the medicines that you take for other issues work in your body.  Depression.  Trouble sleeping (insomnia). Week 3 and afterward After the first 2-3 weeks of quitting, you may start to notice more positive results, such as:  Better sense of smell and taste.  Less coughing and sore throat.  Slower heart rate.  Lower blood pressure.  Clearer skin.  Better breathing.  Fewer sick days. Quitting smoking can be hard. Do not give up if you fail the first time. Some people need to try a few times before they succeed. Do your best to stick to your quit plan, and talk with your doctor if you have any questions or concerns. Summary  Smoking tobacco is the leading  cause of preventable death. Quitting smoking can be hard, but it is one of the best things that you can do  for your health.  When you decide to quit smoking, make a plan to help you succeed.  Quit smoking right away, not slowly over a period of time.  When you start quitting, seek help from your doctor, family, or friends. This information is not intended to replace advice given to you by your health care provider. Make sure you discuss any questions you have with your health care provider. Document Revised: 12/20/2018 Document Reviewed: 06/15/2018 Elsevier Patient Education  [place].

## 2019-12-24 ENCOUNTER — Other Ambulatory Visit: Payer: Self-pay | Admitting: Family Medicine

## 2019-12-24 LAB — CBC WITH DIFFERENTIAL/PLATELET
Absolute Monocytes: 599 cells/uL (ref 200–950)
Basophils Absolute: 44 cells/uL (ref 0–200)
Basophils Relative: 0.4 %
Eosinophils Absolute: 44 cells/uL (ref 15–500)
Eosinophils Relative: 0.4 %
HCT: 45.5 % — ABNORMAL HIGH (ref 35.0–45.0)
Hemoglobin: 15.2 g/dL (ref 11.7–15.5)
Lymphs Abs: 1587 cells/uL (ref 850–3900)
MCH: 30 pg (ref 27.0–33.0)
MCHC: 33.4 g/dL (ref 32.0–36.0)
MCV: 89.9 fL (ref 80.0–100.0)
MPV: 9.8 fL (ref 7.5–12.5)
Monocytes Relative: 5.4 %
Neutro Abs: 8825 cells/uL — ABNORMAL HIGH (ref 1500–7800)
Neutrophils Relative %: 79.5 %
Platelets: 330 10*3/uL (ref 140–400)
RBC: 5.06 10*6/uL (ref 3.80–5.10)
RDW: 12.4 % (ref 11.0–15.0)
Total Lymphocyte: 14.3 %
WBC: 11.1 10*3/uL — ABNORMAL HIGH (ref 3.8–10.8)

## 2019-12-24 LAB — COMPLETE METABOLIC PANEL WITH GFR
AG Ratio: 1.8 (calc) (ref 1.0–2.5)
ALT: 17 U/L (ref 6–29)
AST: 21 U/L (ref 10–30)
Albumin: 4.7 g/dL (ref 3.6–5.1)
Alkaline phosphatase (APISO): 58 U/L (ref 31–125)
BUN: 12 mg/dL (ref 7–25)
CO2: 26 mmol/L (ref 20–32)
Calcium: 10.4 mg/dL — ABNORMAL HIGH (ref 8.6–10.2)
Chloride: 105 mmol/L (ref 98–110)
Creat: 0.73 mg/dL (ref 0.50–1.10)
GFR, Est African American: 123 mL/min/{1.73_m2} (ref >=60)
GFR, Est Non African American: 106 mL/min/{1.73_m2} (ref >=60)
Globulin: 2.6 g/dL (calc) (ref 1.9–3.7)
Glucose, Bld: 87 mg/dL (ref 65–99)
Potassium: 4.5 mmol/L (ref 3.5–5.3)
Sodium: 138 mmol/L (ref 135–146)
Total Bilirubin: 0.4 mg/dL (ref 0.2–1.2)
Total Protein: 7.3 g/dL (ref 6.1–8.1)

## 2019-12-24 LAB — LIPID PANEL
Cholesterol: 205 mg/dL — ABNORMAL HIGH (ref <200)
HDL: 64 mg/dL (ref >=50)
LDL Cholesterol (Calc): 124 mg/dL (calc) — ABNORMAL HIGH
Non-HDL Cholesterol (Calc): 141 mg/dL (calc) — ABNORMAL HIGH (ref <130)
Total CHOL/HDL Ratio: 3.2 (calc) (ref <5.0)
Triglycerides: 78 mg/dL (ref <150)

## 2019-12-24 LAB — TSH: TSH: 0.97 mIU/L

## 2019-12-29 ENCOUNTER — Ambulatory Visit: Payer: Self-pay

## 2019-12-29 ENCOUNTER — Encounter: Payer: Self-pay | Admitting: Family Medicine

## 2019-12-29 NOTE — Telephone Encounter (Signed)
Pt. Reports she has had a cough since July. Now she is seeing blood in her sputum she is coughing up. Having difficulty sleeping at night. Has been on "antibiotics, but I feel worse." Wants to see Ms. Tapia only. Appointment for virtual visit made for Friday. If pt. Can be seen sooner, please notify her. Advise.  Reason for Disposition . [1] Continuous (nonstop) coughing interferes with work or school AND [2] no improvement using cough treatment per Care Advice  Answer Assessment - Initial Assessment Questions 1. ONSET: "When did the cough begin?"      July 2. SEVERITY: "How bad is the cough today?"      Severe 3. SPUTUM: "Describe the color of your sputum" (none, dry cough; clear, white, yellow, green)     Yellow 4. HEMOPTYSIS: "Are you coughing up any blood?" If so ask: "How much?" (flecks, streaks, tablespoons, etc.)     Yes 5. DIFFICULTY BREATHING: "Are you having difficulty breathing?" If Yes, ask: "How bad is it?" (e.g., mild, moderate, severe)    - MILD: No SOB at rest, mild SOB with walking, speaks normally in sentences, can lay down, no retractions, pulse < 100.    - MODERATE: SOB at rest, SOB with minimal exertion and prefers to sit, cannot lie down flat, speaks in phrases, mild retractions, audible wheezing, pulse 100-120.    - SEVERE: Very SOB at rest, speaks in single words, struggling to breathe, sitting hunched forward, retractions, pulse > 120      Mild 6. FEVER: "Do you have a fever?" If Yes, ask: "What is your temperature, how was it measured, and when did it start?"     No 7. CARDIAC HISTORY: "Do you have any history of heart disease?" (e.g., heart attack, congestive heart failure)      No 8. LUNG HISTORY: "Do you have any history of lung disease?"  (e.g., pulmonary embolus, asthma, emphysema)     No 9. PE RISK FACTORS: "Do you have a history of blood clots?" (or: recent major surgery, recent prolonged travel, bedridden)     No 10. OTHER SYMPTOMS: "Do you have any other  symptoms?" (e.g., runny nose, wheezing, chest pain)       Chest feels tight 11. PREGNANCY: "Is there any chance you are pregnant?" "When was your last menstrual period?"       No 12. TRAVEL: "Have you traveled out of the country in the last month?" (e.g., travel history, exposures)       No  Protocols used: COUGH - ACUTE PRODUCTIVE-A-AH

## 2019-12-29 NOTE — Telephone Encounter (Signed)
Try to get f/up appt - don't know if it is from nose, throat or bronchitis -  I would go to [facility] if any concerning pain, SOB, CP etc

## 2020-01-01 NOTE — Progress Notes (Signed)
Name: Leslie [name] Vanness   MRN: [id number]    DOB: 01/13/1986   Date:01/02/2020       Progress Note  Subjective:    I connected with  Leslie [name]  on 01/02/20 at  9:00 AM EDT by [name] video enabled telemedicine application and verified that I am speaking with the correct person using two identifiers.  I discussed the limitations of evaluation and management by telemedicine and the availability of in person appointments. The patient expressed understanding and agreed to proceed. Staff also discussed with the patient that there may be [name] patient responsible charge related to this service. Patient Location: home Provider Location: [facility]  Additional Individuals present:   none  Chief Complaint  Patient presents with  . Cough    continued cough for 2 months, she has tried OTC meds-mucinex, cough syrup, decongestant, she finished prednisone yesterday    Leslie [name] Leslie [name] is [name] 34 y.o. female, presents for virtual visit to f/up on cough/wheeze  She was seen for her complete physical 2 weeks ago had had over [name] month of [name] cough was treated with steroids, cough medications, antibiotics and she has had only slight improvement in her symptoms though does seem to be starting to get better.   Cough still bothersome, esp at night and in the morning Productive sputum first thing in the morning dark green  She is experiencing less wheeze and shortness of breath She denies any chest pain, shortness of breath, night sweats, fatigue, fever   Patient Active Problem List   Diagnosis Date Noted  . Hyperlipidemia 12/23/2019  . Family history of thyroid disease 12/23/2019  . Current smoker 12/23/2019  . Family history of diabetes mellitus in mother 12/23/2019  . Encounter for tobacco use cessation counseling 09/09/2018  . Fatigue 09/09/2018  . Current moderate episode of major depressive disorder without prior episode (HCC) 09/09/2018  . Gastroesophageal reflux disease without esophagitis 09/09/2018  .  Cholecystitis, chronic 02/23/2016  . Lumbosacral radiculopathy 01/07/2015    Current Outpatient Medications:  .  albuterol (VENTOLIN HFA) 108 (90 Base) MCG/ACT inhaler, Inhale 2 puffs into the lungs every 4 (four) hours as needed for wheezing or shortness of breath., Disp: 18 g, Rfl: 0 .  fluticasone (FLONASE) 50 MCG/ACT nasal spray, Place 2 sprays into both nostrils daily., Disp: 16 g, Rfl: 2 .  ibuprofen (ADVIL) 600 MG tablet, Take 1 tablet (600 mg total) by mouth every 6 (six) hours as needed., Disp: 30 tablet, Rfl: 0 .  ipratropium-albuterol (DUONEB) 0.5-2.5 (3) MG/3ML SOLN, Take 3 mLs by nebulization every 6 (six) hours as needed., Disp: 180 mL, Rfl: 2 .  levonorgestrel (MIRENA) 20 MCG/24HR IUD, 1 each by Intrauterine route once., Disp: , Rfl:  .  omeprazole (PRILOSEC) 20 MG capsule, Take 1 capsule (20 mg total) by mouth 2 (two) times daily before [name] meal., Disp: 60 capsule, Rfl: 1 Allergies  Allergen Reactions  . Amoxicillin Hives    Has patient had [name] PCN reaction causing immediate rash, facial/tongue/throat swelling, SOB or lightheadedness with hypotension: {no Has patient had [name] PCN reaction causing severe rash involving mucus membranes or skin necrosis: no Has patient had [name] PCN reaction that required hospitalization no Has patient had [name] PCN reaction occurring within the last 10 years: no If all of the above answers are "NO", then may proceed with Cephalosporin use.  . Bactrim [Sulfamethoxazole-Trimethoprim] Hives  . Penicillins Hives    Has patient had [name] PCN reaction causing immediate rash, facial/tongue/throat swelling,  SOB or lightheadedness with hypotension: {no Has patient had [name] PCN reaction causing severe rash involving mucus membranes or skin necrosis: {no Has patient had [name] PCN reaction that required hospitalization no Has patient had [name] PCN reaction occurring within the last 10 years: {no If all of the above answers are "NO", then may proceed with Cephalosporin use.  . Tape  Rash    PT TOLERATES PAPER TAPE WELL    Past Surgical History:  Procedure Laterality Date  . BREAST SURGERY     augmentation  . CESAREAN SECTION  2005  . CESAREAN SECTION  2016  . CESAREAN SECTION  2005, 2016  . CHOLECYSTECTOMY N/[name] 02/14/2016   Procedure: LAPAROSCOPIC CHOLECYSTECTOMY WITH INTRAOPERATIVE CHOLANGIOGRAM;  Surgeon: Earline Mayotte, MD;  Location: [facility] ORS;  Service: General;  Laterality: N/[name];  . TONSILLECTOMY AND ADENOIDECTOMY  1987  . TONSILLECTOMY AND ADENOIDECTOMY  1987   Family History  Problem Relation Age of Onset  . Diabetes Mother   . Thyroid disease Maternal Aunt   . Ovarian cancer Maternal Grandmother   . Heart attack Maternal Grandmother   . Cervical cancer Maternal Grandmother   . Heart attack Maternal Grandfather   . Emphysema Paternal Grandmother    Social History   Socioeconomic History  . Marital status: Married    Spouse name: Windy Kalata  . Number of children: 2  . Years of education: 76  . Highest education level: 11th grade  Occupational History  . Not on file  Tobacco Use  . Smoking status: Current Every Day Smoker    Packs/day: 0.15    Years: 15.00    Pack years: 2.25    Types: Cigarettes  . Smokeless tobacco: Never Used  Vaping Use  . Vaping Use: Never used  Substance and Sexual Activity  . Alcohol use: Yes    Alcohol/week: 0.0 standard drinks    Comment: wine WEEKENDS  . Drug use: No    Comment: pt denies during phone interview but UDS was + for marijuana in 2016  . Sexual activity: Yes  Other Topics Concern  . Not on file  Social History Narrative   ** Merged History Encounter **       Social Determinants of Health   Financial Resource Strain:   . Difficulty of Paying Living Expenses: Not on file  Food Insecurity:   . Worried About Programme researcher, broadcasting/film/video in the Last Year: Not on file  . Ran Out of Food in the Last Year: Not on file  Transportation Needs:   . Lack of Transportation (Medical): Not on file  . Lack of  Transportation (Non-Medical): Not on file  Physical Activity:   . Days of Exercise per Week: Not on file  . Minutes of Exercise per Session: Not on file  Stress:   . Feeling of Stress : Not on file  Social Connections:   . Frequency of Communication with Friends and Family: Not on file  . Frequency of Social Gatherings with Friends and Family: Not on file  . Attends Religious Services: Not on file  . Active Member of Clubs or Organizations: Not on file  . Attends Banker Meetings: Not on file  . Marital Status: Not on file  Intimate Partner Violence:   . Fear of Current or Ex-Partner: Not on file  . Emotionally Abused: Not on file  . Physically Abused: Not on file  . Sexually Abused: Not on file    Chart Review Today: I personally reviewed active  problem list, medication list, allergies, family history, social history, health maintenance, notes from last encounter, lab results, imaging with the patient/caregiver today.   Review of Systems  Constitutional: Negative.   HENT: Negative.   Eyes: Negative.   Respiratory: Negative.   Cardiovascular: Negative.   Gastrointestinal: Negative.   Endocrine: Negative.   Genitourinary: Negative.   Musculoskeletal: Negative.   Skin: Negative.   Allergic/Immunologic: Negative.   Neurological: Negative.   Hematological: Negative.   Psychiatric/Behavioral: Negative.   All other systems reviewed and are negative.     Objective:    Virtual encounter, vitals limited, only able to obtain the following There were no vitals filed for this visit. There is no height or weight on file to calculate BMI. Nursing Note and Vital Signs reviewed.  Physical Exam Vitals and nursing note reviewed.  Constitutional:      Appearance: Normal appearance.  Pulmonary:     Effort: No respiratory distress.     Comments: Intermittent coughing fits able to speak in full and complete sentences, no respiratory distress, visible accessory muscle  use or retractions, no audible wheeze or stridor Neurological:     Mental Status: She is alert.  Psychiatric:        Mood and Affect: Mood normal.      Assessment and Plan:     ICD-10-CM   1. Cough  R05 benzonatate (TESSALON) 200 MG capsule    Cetirizine HCl (ZYRTEC ALLERGY) 10 MG TBDP    promethazine-dextromethorphan (PROMETHAZINE-DM) 6.25-15 MG/5ML syrup    guaiFENesin (MUCINEX) 600 MG 12 hr tablet    DISCONTINUED: budesonide-formoterol (SYMBICORT) 80-4.5 MCG/ACT inhaler  2. Gastroesophageal reflux disease without esophagitis  K21.9 omeprazole (PRILOSEC) 20 MG capsule  3. Bronchitis with asthma, subacute  J20.9 benzonatate (TESSALON) 200 MG capsule   J45.909 Cetirizine HCl (ZYRTEC ALLERGY) 10 MG TBDP    promethazine-dextromethorphan (PROMETHAZINE-DM) 6.25-15 MG/5ML syrup    guaiFENesin (MUCINEX) 600 MG 12 hr tablet    DISCONTINUED: budesonide-formoterol (SYMBICORT) 80-4.5 MCG/ACT inhaler  Patient with nearly 2 months of cough, chest congestion and wheeze, she did get antibiotics [name] long steroid taper and duo nebs with some cough medicines about 2 weeks ago and though she is still having some coughing it is starting to improve.  She does not have any concerning symptoms at this point in time no fever no shortness of breath no night sweats or severe fatigue or chest pain.  She recently stopped smoking may be some increased cough that can be prolonged shortly after smoking cessation,.  Also could be bronchitis that has lasted slightly longer than usual.  Encouraged her to continue DuoNebs, over-the-counter cough medications and Mucinex.  I do think it be helpful for her to use [name] daily inhaler to see if this helps with her coughing fits that sound like bronchospasms.  When she was here 2 weeks ago she was extremely wheezy in clinic and I am unable to reexamine her today to see if this is improved.  We discussed [name] virtual encounter today that if the daily maintenance inhaler is expensive or and  affordable that she could come into the clinic and get [name] sample of [name] maintenance inhaler, she may need it for 2 to 4 weeks   She did endorse some postnasal drip and allergy symptoms I encouraged her to treat her nasal symptoms with antihistamines to help dry up some of the mucus and source of congestion  I discussed the assessment and treatment plan with the patient. The patient was  provided an opportunity to ask questions and all were answered. The patient agreed with the plan and demonstrated an understanding of the instructions.  The patient was advised to call back or seek an in-person evaluation if the symptoms worsen or if the condition fails to improve as anticipated.  I provided 20+ minutes of non-face-to-face time during this encounter.  Danelle Berry, PA-C 01/02/20 8:46 AM

## 2020-01-02 ENCOUNTER — Encounter: Payer: Self-pay | Admitting: Family Medicine

## 2020-01-02 ENCOUNTER — Telehealth (INDEPENDENT_AMBULATORY_CARE_PROVIDER_SITE_OTHER): Payer: BC Managed Care – PPO | Admitting: Family Medicine

## 2020-01-02 DIAGNOSIS — J45909 Unspecified asthma, uncomplicated: Secondary | ICD-10-CM

## 2020-01-02 DIAGNOSIS — K219 Gastro-esophageal reflux disease without esophagitis: Secondary | ICD-10-CM | POA: Diagnosis not present

## 2020-01-02 DIAGNOSIS — R05 Cough: Secondary | ICD-10-CM

## 2020-01-02 DIAGNOSIS — J209 Acute bronchitis, unspecified: Secondary | ICD-10-CM | POA: Diagnosis not present

## 2020-01-02 DIAGNOSIS — R059 Cough, unspecified: Secondary | ICD-10-CM

## 2020-01-02 MED ORDER — GUAIFENESIN ER 600 MG PO TB12: 600.0000 mg | ORAL_TABLET | Freq: Two times a day (BID) | ORAL | 0 refills | Status: AC

## 2020-01-02 MED ORDER — ZYRTEC ALLERGY 10 MG PO TBDP: 10.0000 mg | ORAL_TABLET | Freq: Every day | ORAL | 0 refills | Status: DC

## 2020-01-02 MED ORDER — PROMETHAZINE-DM 6.25-15 MG/5ML PO SYRP: 5.0000 mL | ORAL_SOLUTION | Freq: Three times a day (TID) | ORAL | 0 refills | Status: DC | PRN

## 2020-01-02 MED ORDER — BUDESONIDE-FORMOTEROL FUMARATE 80-4.5 MCG/ACT IN AERO: 2.0000 | INHALATION_SPRAY | Freq: Two times a day (BID) | RESPIRATORY_TRACT | 12 refills | Status: DC

## 2020-01-02 MED ORDER — BENZONATATE 200 MG PO CAPS: 200.0000 mg | ORAL_CAPSULE | Freq: Three times a day (TID) | ORAL | 1 refills | Status: DC | PRN

## 2020-01-02 MED ORDER — OMEPRAZOLE 20 MG PO CPDR: 20.0000 mg | DELAYED_RELEASE_CAPSULE | Freq: Two times a day (BID) | ORAL | 5 refills | Status: DC

## 2020-01-08 ENCOUNTER — Encounter: Payer: Self-pay | Admitting: Family Medicine

## 2020-02-03 ENCOUNTER — Other Ambulatory Visit: Payer: Self-pay

## 2020-02-03 ENCOUNTER — Encounter: Payer: Self-pay | Admitting: Family Medicine

## 2020-02-03 ENCOUNTER — Ambulatory Visit: Payer: BC Managed Care – PPO | Admitting: Family Medicine

## 2020-02-03 VITALS — BP 110/68 | HR 98 | Temp 98.3°F | Resp 18 | Ht 65.0 in | Wt 132.2 lb

## 2020-02-03 DIAGNOSIS — K219 Gastro-esophageal reflux disease without esophagitis: Secondary | ICD-10-CM | POA: Diagnosis not present

## 2020-02-03 DIAGNOSIS — R059 Cough, unspecified: Secondary | ICD-10-CM | POA: Diagnosis not present

## 2020-02-03 DIAGNOSIS — J209 Acute bronchitis, unspecified: Secondary | ICD-10-CM | POA: Diagnosis not present

## 2020-02-03 DIAGNOSIS — F17200 Nicotine dependence, unspecified, uncomplicated: Secondary | ICD-10-CM

## 2020-02-03 DIAGNOSIS — J45909 Unspecified asthma, uncomplicated: Secondary | ICD-10-CM

## 2020-02-03 MED ORDER — TRELEGY ELLIPTA 100-62.5-25 MCG/INH IN AEPB: 1.0000 | INHALATION_SPRAY | Freq: Every day | RESPIRATORY_TRACT | 11 refills | Status: DC

## 2020-02-03 NOTE — Progress Notes (Signed)
Patient ID: Leslie [name], female    DOB: Aug 07, 1985, 34 y.o.   MRN: [id number]  PCP: Danelle Berry, PA-C  Chief Complaint  Patient presents with  . Follow-up    Subjective:   Leslie [name] is a 34 y.o. female, presents to clinic with CC of the following:  F/up on cough, wheeze and SOB on going over the past 2-3 months  She is finally feeling better Was given Breo sample, not taking daily Wheeze has improved She is treating nasal and sinus allergies with intranasal steroids spray and antihistamines She continues to treat GERD with omeprazole  Using duonebs and rescue inhaler much less Continues to have mild occasional cough Still trying to quit smoking, at about 2 cigs a day  Patient Active Problem List   Diagnosis Date Noted  . Hyperlipidemia 12/23/2019  . Family history of thyroid disease 12/23/2019  . Current smoker 12/23/2019  . Family history of diabetes mellitus in mother 12/23/2019  . Encounter for tobacco use cessation counseling 09/09/2018  . Fatigue 09/09/2018  . Current moderate episode of major depressive disorder without prior episode (HCC) 09/09/2018  . Gastroesophageal reflux disease without esophagitis 09/09/2018  . Cholecystitis, chronic 02/23/2016  . Lumbosacral radiculopathy 01/07/2015      Current Outpatient Medications:  .  albuterol (VENTOLIN HFA) 108 (90 Base) MCG/ACT inhaler, Inhale 2 puffs into the lungs every 4 (four) hours as needed for wheezing or shortness of breath., Disp: 18 g, Rfl: 0 .  benzonatate (TESSALON) 200 MG capsule, Take 1 capsule (200 mg total) by mouth 3 (three) times daily as needed for cough., Disp: 30 capsule, Rfl: 1 .  Cetirizine HCl (ZYRTEC ALLERGY) 10 MG TBDP, Take 10 mg by mouth at bedtime., Disp: 30 tablet, Rfl: 0 .  fluticasone (FLONASE) 50 MCG/ACT nasal spray, Place 2 sprays into both nostrils daily., Disp: 16 g, Rfl: 2 .  ibuprofen (ADVIL) 600 MG tablet, Take 1 tablet (600 mg total) by mouth every 6 (six) hours as  needed., Disp: 30 tablet, Rfl: 0 .  ipratropium-albuterol (DUONEB) 0.5-2.5 (3) MG/3ML SOLN, Take 3 mLs by nebulization every 6 (six) hours as needed., Disp: 180 mL, Rfl: 2 .  omeprazole (PRILOSEC) 20 MG capsule, Take 1 capsule (20 mg total) by mouth 2 (two) times daily before a meal., Disp: 60 capsule, Rfl: 5 .  Fluticasone-Umeclidin-Vilant (TRELEGY ELLIPTA) 100-62.5-25 MCG/INH AEPB, Inhale 1 puff into the lungs daily. (use same time of day daily), Disp: 28 each, Rfl: 11 .  levonorgestrel (MIRENA) 20 MCG/24HR IUD, 1 each by Intrauterine route once. (Patient not taking: Reported on 02/03/2020), Disp: , Rfl:  .  promethazine-dextromethorphan (PROMETHAZINE-DM) 6.25-15 MG/5ML syrup, Take 5 mLs by mouth 3 (three) times daily as needed for cough. (Patient not taking: Reported on 02/03/2020), Disp: 118 mL, Rfl: 0   Allergies  Allergen Reactions  . Amoxicillin Hives    Has patient had a PCN reaction causing immediate rash, facial/tongue/throat swelling, SOB or lightheadedness with hypotension: {no Has patient had a PCN reaction causing severe rash involving mucus membranes or skin necrosis: no Has patient had a PCN reaction that required hospitalization no Has patient had a PCN reaction occurring within the last 10 years: no If all of the above answers are "NO", then may proceed with Cephalosporin use.  . Bactrim [Sulfamethoxazole-Trimethoprim] Hives  . Penicillins Hives    Has patient had a PCN reaction causing immediate rash, facial/tongue/throat swelling, SOB or lightheadedness with hypotension: {no Has patient had a PCN reaction  causing severe rash involving mucus membranes or skin necrosis: {no Has patient had a PCN reaction that required hospitalization no Has patient had a PCN reaction occurring within the last 10 years: {no If all of the above answers are "NO", then may proceed with Cephalosporin use.  . Tape Rash    PT TOLERATES PAPER TAPE WELL     Social History   Tobacco Use  .  Smoking status: Current Every Day Smoker    Packs/day: 0.15    Years: 15.00    Pack years: 2.25    Types: Cigarettes  . Smokeless tobacco: Never Used  Vaping Use  . Vaping Use: Never used  Substance Use Topics  . Alcohol use: Yes    Alcohol/week: 0.0 standard drinks    Comment: wine WEEKENDS  . Drug use: No    Comment: pt denies during phone interview but UDS was + for marijuana in 2016      Chart Review Today: I personally reviewed active problem list, medication list, allergies, family history, social history, health maintenance, notes from last encounter, lab results, imaging with the patient/caregiver today.   Review of Systems 10 Systems reviewed and are negative for acute change except as noted in the HPI.     Objective:   Vitals:   02/03/20 1053  BP: 110/68  Pulse: 98  Resp: 18  Temp: 98.3 F (36.8 C)  TempSrc: Oral  SpO2: 99%  Weight: 132 lb 3.2 oz (60 kg)  Height: 5\' 5"  (1.651 m)    Body mass index is 22 kg/m.  Physical Exam Vitals and nursing note reviewed.  Constitutional:      General: She is not in acute distress.    Appearance: Normal appearance. She is well-developed. She is not ill-appearing, toxic-appearing or diaphoretic.     Interventions: Face mask in place.  HENT:     Head: Normocephalic and atraumatic.     Right Ear: External ear normal.     Left Ear: External ear normal.  Eyes:     General: Lids are normal. No scleral icterus.       Right eye: No discharge.        Left eye: No discharge.     Conjunctiva/sclera: Conjunctivae normal.  Neck:     Trachea: Phonation normal. No tracheal deviation.  Cardiovascular:     Rate and Rhythm: Normal rate and regular rhythm.     Pulses: Normal pulses.          Radial pulses are 2+ on the right side and 2+ on the left side.       Posterior tibial pulses are 2+ on the right side and 2+ on the left side.     Heart sounds: Normal heart sounds. No murmur heard. No friction rub. No gallop.    Pulmonary:     Effort: Pulmonary effort is normal. No respiratory distress.     Breath sounds: Normal breath sounds. No stridor. No wheezing, rhonchi or rales.  Chest:     Chest wall: No tenderness.  Abdominal:     General: Bowel sounds are normal. There is no distension.     Palpations: Abdomen is soft.  Musculoskeletal:     Right lower leg: No edema.     Left lower leg: No edema.  Skin:    General: Skin is warm and dry.     Coloration: Skin is not jaundiced or pale.     Findings: No rash.  Neurological:     Mental Status:  She is alert.     Motor: No abnormal muscle tone.     Gait: Gait normal.  Psychiatric:        Mood and Affect: Mood normal.        Speech: Speech normal.        Behavior: Behavior normal.      Results for orders placed or performed in visit on 12/23/19  Lipid panel  Result Value Ref Range   Cholesterol 205 (H) <200 mg/dL   HDL 64 > OR = 50 mg/dL   Triglycerides 78 <161 mg/dL   LDL Cholesterol (Calc) 124 (H) mg/dL (calc)   Total CHOL/HDL Ratio 3.2 <5.0 (calc)   Non-HDL Cholesterol (Calc) 141 (H) <130 mg/dL (calc)  COMPLETE METABOLIC PANEL WITH GFR  Result Value Ref Range   Glucose, Bld 87 65 - 99 mg/dL   BUN 12 7 - 25 mg/dL   Creat 0.96 0.45 - 4.09 mg/dL   GFR, Est Non African American 106 > OR = 60 mL/min/1.58m2   GFR, Est African American 123 > OR = 60 mL/min/1.50m2   BUN/Creatinine Ratio NOT APPLICABLE 6 - 22 (calc)   Sodium 138 135 - 146 mmol/L   Potassium 4.5 3.5 - 5.3 mmol/L   Chloride 105 98 - 110 mmol/L   CO2 26 20 - 32 mmol/L   Calcium 10.4 (H) 8.6 - 10.2 mg/dL   Total Protein 7.3 6.1 - 8.1 g/dL   Albumin 4.7 3.6 - 5.1 g/dL   Globulin 2.6 1.9 - 3.7 g/dL (calc)   AG Ratio 1.8 1.0 - 2.5 (calc)   Total Bilirubin 0.4 0.2 - 1.2 mg/dL   Alkaline phosphatase (APISO) 58 31 - 125 U/L   AST 21 10 - 30 U/L   ALT 17 6 - 29 U/L  CBC w/Diff/Platelet  Result Value Ref Range   WBC 11.1 (H) 3.8 - 10.8 Thousand/uL   RBC 5.06 3.80 - 5.10  Million/uL   Hemoglobin 15.2 11.7 - 15.5 g/dL   HCT 81.1 (H) 35 - 45 %   MCV 89.9 80.0 - 100.0 fL   MCH 30.0 27.0 - 33.0 pg   MCHC 33.4 32.0 - 36.0 g/dL   RDW 91.4 78.2 - 95.6 %   Platelets 330 140 - 400 Thousand/uL   MPV 9.8 7.5 - 12.5 fL   Neutro Abs 8,825 (H) 1,500 - 7,800 cells/uL   Lymphs Abs 1,587 850 - 3,900 cells/uL   Absolute Monocytes 599 200 - 950 cells/uL   Eosinophils Absolute 44 15.0 - 500.0 cells/uL   Basophils Absolute 44 0.0 - 200.0 cells/uL   Neutrophils Relative % 79.5 %   Total Lymphocyte 14.3 %   Monocytes Relative 5.4 %   Eosinophils Relative 0.4 %   Basophils Relative 0.4 %  TSH  Result Value Ref Range   TSH 0.97 mIU/L       Assessment & Plan:      ICD-10-CM   1. Bronchitis with asthma, subacute  J20.9 Fluticasone-Umeclidin-Vilant (TRELEGY ELLIPTA) 100-62.5-25 MCG/INH AEPB   J45.909    Rx for Trelegy encourage smoking cessation, managing any allergies or triggers including GERD, lungs sound significantly improved  2. Cough  R05.9    Some gradual improvement  3. Gastroesophageal reflux disease without esophagitis  K21.9    Continue as needed management of GERD symptoms  4. Current smoker  F17.200    Continue working on smoking cessation     Encouraged pt to continue maintenance inhaler - given sample of trelegy and  instructions on drug coupon card reviewed Continue daily use Continue allergy/sinus and GERD tx as needed With any worsening respiratory sx, increase nebs, use decongestants, cough meds OTC and mucinex - advised f/up if not improving in the first ~5 days or so to be able to start oral steroids and hopefully avoid another long bronchitis/PNA illness  Smoking cessation reviewed  Pt will f/up as needed   F/up next year set up for her CPE     Danelle Berry, PA-C 02/03/20 11:33 AM

## 2020-04-22 DIAGNOSIS — Z20822 Contact with and (suspected) exposure to covid-19: Secondary | ICD-10-CM | POA: Diagnosis not present

## 2020-04-22 DIAGNOSIS — J029 Acute pharyngitis, unspecified: Secondary | ICD-10-CM | POA: Diagnosis not present

## 2020-04-22 DIAGNOSIS — R070 Pain in throat: Secondary | ICD-10-CM | POA: Diagnosis not present

## 2020-06-17 ENCOUNTER — Encounter: Payer: Self-pay | Admitting: Family Medicine

## 2020-06-23 ENCOUNTER — Ambulatory Visit: Payer: BC Managed Care – PPO | Admitting: Family Medicine

## 2020-06-28 ENCOUNTER — Ambulatory Visit: Payer: BC Managed Care – PPO | Admitting: Family Medicine

## 2020-06-28 NOTE — Progress Notes (Deleted)
    SUBJECTIVE:   CHIEF COMPLAINT / HPI:   BREAST LUMP - noticing small lumps in L breast, outer side into underarm. Tender to touch.  Duration :weeks Location: {Blank single:19197::"right","left","bilateral"} Onset: {Blank single:19197::"sudden","gradual"} Pain: {Blank single:19197::"mild","moderate","severe","1/10","2/10","3/10","4/10","5/10","6/10","7/10","8/10","9/10","10/10"} Quality: {Blank multiple:19196::"sharp","dull","aching","burning","cramping","ill-defined","itchy","pressure-like","pulling","shooting","sore","stabbing","tender","tearing","throbbing"} Frequency: {Blank single:19197::"constant","intermittent","occasional","rare","every few minutes","a few times a hour","a few times a day","a few times a week","a few times a month","a few times a year"} Redness: {Blank single:19197::"yes","no"} Swelling: {Blank single:19197::"yes","no"} Trauma: {Blank single:19197::"trauma","no trauma"} Breastfeeding: {Blank single:19197::"yes","no"} Associated with menstral cycle: {Blank single:19197::"yes","no"} Nipple discharge: {Blank single:19197::"yes","no"} Status: {Blank multiple:19196::"better","worse","stable","fluctuating"} Treatments attempted: {Blank multiple:19196::"none","primrose oil","ibuprofen","tylenol"} Previous mammogram: no  FH of breast cancer: *** - On mirena***   OBJECTIVE:   There were no vitals taken for this visit.  ***  ASSESSMENT/PLAN:   No problem-specific Assessment & Plan notes found for this encounter.     Caro Laroche, DO [facility]   {    This will disappear when note is signed, click to select method of visit    :1}

## 2020-07-02 DIAGNOSIS — N6320 Unspecified lump in the left breast, unspecified quadrant: Secondary | ICD-10-CM | POA: Diagnosis not present

## 2020-09-05 ENCOUNTER — Ambulatory Visit
Admission: EM | Admit: 2020-09-05 | Discharge: 2020-09-05 | Disposition: A | Discharge status: Home / Self Care | Payer: BC Managed Care – PPO | Attending: Sports Medicine | Admitting: Sports Medicine

## 2020-09-05 ENCOUNTER — Encounter: Payer: Self-pay | Admitting: Emergency Medicine

## 2020-09-05 ENCOUNTER — Other Ambulatory Visit: Payer: Self-pay

## 2020-09-05 DIAGNOSIS — R519 Headache, unspecified: Secondary | ICD-10-CM | POA: Diagnosis not present

## 2020-09-05 DIAGNOSIS — R0981 Nasal congestion: Secondary | ICD-10-CM | POA: Diagnosis not present

## 2020-09-05 DIAGNOSIS — R059 Cough, unspecified: Secondary | ICD-10-CM | POA: Diagnosis not present

## 2020-09-05 DIAGNOSIS — U071 COVID-19: Secondary | ICD-10-CM | POA: Diagnosis not present

## 2020-09-05 DIAGNOSIS — B349 Viral infection, unspecified: Secondary | ICD-10-CM | POA: Insufficient documentation

## 2020-09-05 DIAGNOSIS — R509 Fever, unspecified: Secondary | ICD-10-CM | POA: Diagnosis not present

## 2020-09-05 LAB — RESP PANEL BY RT-PCR (FLU A&B, COVID) ARPGX2
Influenza A by PCR: NEGATIVE
Influenza B by PCR: NEGATIVE
SARS Coronavirus 2 by RT PCR: POSITIVE — AB

## 2020-09-05 MED ORDER — PSEUDOEPH-BROMPHEN-DM 30-2-10 MG/5ML PO SYRP: 5.0000 mL | ORAL_SOLUTION | Freq: Four times a day (QID) | ORAL | 0 refills | Status: DC | PRN

## 2020-09-05 NOTE — Discharge Instructions (Addendum)
You are positive for COVID, negative for influenza. Please see educational handouts. I sent in a medication for your cough. I provided you a work note keeping her out of work for least 5 days per the current CDC guidelines.  You do need to quarantine. Plenty of rest, plenty fluids, Tylenol or Motrin for any fever or discomfort. If symptoms persist please see your PCP. If symptoms worsen please go to the ER.

## 2020-09-05 NOTE — ED Triage Notes (Signed)
Patient c/o cough, fever, and bodyaches that started yesterday.  

## 2020-09-05 NOTE — ED Provider Notes (Signed)
[facility]    CSN: [id number] Arrival date & time: 09/05/20  0918      History   Chief Complaint Chief Complaint  Patient presents with  . Generalized Body Aches  . Cough  . Fever    HPI Leslie [name] is a 35 y.o. female.   Patient is a 35 year old female who presents for evaluation of the above issue.  She gets her primary care needs met at [facility] in [place].  She works in [employer] as a Programme researcher, broadcasting/film/video.  She reports URI symptoms and flulike illness since yesterday.  This includes fever, myalgia, chills, headache, sweats, cough, congestion, mild nausea.  No history of asthma.  She denies any major medical issues in she says she does not take medicines on a regular basis.  She denies any chest pain or shortness of breath.  She has not been vaccinated against COVID or influenza.  She does not have any documented COVID or influenza exposure although she has around the public a lot.  She has not had COVID.  No red flag signs or symptoms elicited on history.     Past Medical History:  Diagnosis Date  . Anxiety   . Back injury   . Bulging lumbar disc   . Complication of anesthesia   . Family history of adverse reaction to anesthesia    N/V-Mom  . GERD (gastroesophageal reflux disease)   . Headache    H/O  . PONV (postoperative nausea and vomiting)     Patient Active Problem List   Diagnosis Date Noted  . Hyperlipidemia 12/23/2019  . Family history of thyroid disease 12/23/2019  . Current smoker 12/23/2019  . Family history of diabetes mellitus in mother 12/23/2019  . Encounter for tobacco use cessation counseling 09/09/2018  . Fatigue 09/09/2018  . Current moderate episode of major depressive disorder without prior episode ([place]) 09/09/2018  . Gastroesophageal reflux disease without esophagitis 09/09/2018  . Cholecystitis, chronic 02/23/2016  . Lumbosacral radiculopathy 01/07/2015    Past Surgical History:  Procedure Laterality  Date  . BREAST SURGERY     augmentation  . CESAREAN SECTION  2005  . CESAREAN SECTION  2016  . CESAREAN SECTION  2005, 2016  . CHOLECYSTECTOMY N/A 02/14/2016   Procedure: LAPAROSCOPIC CHOLECYSTECTOMY WITH INTRAOPERATIVE CHOLANGIOGRAM;  Surgeon: Robert Bellow, MD;  Location: [facility] ORS;  Service: General;  Laterality: N/A;  . TONSILLECTOMY AND ADENOIDECTOMY  1987  . TONSILLECTOMY AND ADENOIDECTOMY  1987    OB History    Gravida  5   Para  2   Term  0   Preterm  0   AB  2   Living        SAB  1   IAB  0   Ectopic  0   Multiple      Live Births           Obstetric Comments  1st Menstrual Cycle:  10  1st Pregnancy:  17  1st Menstrual Cycle:  10 1st Pregnancy:  17          Home Medications    Prior to Admission medications   Medication Sig Start Date End Date Taking? Authorizing Provider  brompheniramine-pseudoephedrine-DM 30-2-10 MG/5ML syrup Take 5 mLs by mouth 4 (four) times daily as needed. 09/05/20  Yes Verda Cumins, MD  levonorgestrel (MIRENA) 20 MCG/24HR IUD 1 each by Intrauterine route once.   Yes [provider]  albuterol (VENTOLIN HFA) 108 (90 Base) MCG/ACT inhaler  Inhale 2 puffs into the lungs every 4 (four) hours as needed for wheezing or shortness of breath. 11/27/19   Katy Apo, NP  benzonatate (TESSALON) 200 MG capsule Take 1 capsule (200 mg total) by mouth 3 (three) times daily as needed for cough. 01/02/20   Delsa Grana, PA-C  Cetirizine HCl (ZYRTEC ALLERGY) 10 MG TBDP Take 10 mg by mouth at bedtime. 01/02/20   Delsa Grana, PA-C  fluticasone (FLONASE) 50 MCG/ACT nasal spray Place 2 sprays into both nostrils daily. 12/23/19   Delsa Grana, PA-C  Fluticasone-Umeclidin-Vilant (TRELEGY ELLIPTA) 100-62.5-25 MCG/INH AEPB Inhale 1 puff into the lungs daily. (use same time of day daily) 02/03/20   Delsa Grana, PA-C  ibuprofen (ADVIL) 600 MG tablet Take 1 tablet (600 mg total) by mouth every 6 (six) hours as needed. 11/13/19   Melynda Ripple, MD  ipratropium-albuterol (DUONEB) 0.5-2.5 (3) MG/3ML SOLN Take 3 mLs by nebulization every 6 (six) hours as needed. 12/23/19   Delsa Grana, PA-C  omeprazole (PRILOSEC) 20 MG capsule Take 1 capsule (20 mg total) by mouth 2 (two) times daily before a meal. 01/02/20   Delsa Grana, PA-C    Family History Family History  Problem Relation Age of Onset  . Diabetes Mother   . Thyroid disease Maternal Aunt   . Ovarian cancer Maternal Grandmother   . Heart attack Maternal Grandmother   . Cervical cancer Maternal Grandmother   . Heart attack Maternal Grandfather   . Emphysema Paternal Grandmother     Social History Social History   Tobacco Use  . Smoking status: Current Every Day Smoker    Packs/day: 0.15    Years: 15.00    Pack years: 2.25    Types: Cigarettes  . Smokeless tobacco: Never Used  Vaping Use  . Vaping Use: Never used  Substance Use Topics  . Alcohol use: Yes    Alcohol/week: 0.0 standard drinks    Comment: wine WEEKENDS  . Drug use: No    Comment: pt denies during phone interview but UDS was + for marijuana in 2016     Allergies   Amoxicillin, Bactrim [sulfamethoxazole-trimethoprim], Penicillins, and Tape   Review of Systems Review of Systems  Constitutional: Positive for activity change, chills, diaphoresis, fatigue and fever. Negative for appetite change.  HENT: Positive for congestion, postnasal drip and sore throat. Negative for ear pain, rhinorrhea, sinus pressure, sinus pain and sneezing.   Eyes: Negative for pain.  Respiratory: Positive for cough. Negative for chest tightness, shortness of breath and wheezing.   Cardiovascular: Negative for chest pain and palpitations.  Gastrointestinal: Positive for nausea. Negative for abdominal pain, diarrhea and vomiting.  Genitourinary: Negative for dysuria.  Musculoskeletal: Positive for arthralgias and myalgias. Negative for back pain, neck pain and neck stiffness.  Skin: Negative for color change, pallor,  rash and wound.  Neurological: Positive for headaches. Negative for dizziness, light-headedness and numbness.  All other systems reviewed and are negative.    Physical Exam Triage Vital Signs ED Triage Vitals  Enc Vitals Group     BP 09/05/20 0932 111/87     Pulse Rate 09/05/20 0932 88     Resp 09/05/20 0932 14     Temp 09/05/20 0932 98.1 F (36.7 C)     Temp Source 09/05/20 0932 Oral     SpO2 09/05/20 0932 100 %     Weight 09/05/20 0930 128 lb (58.1 kg)     Height 09/05/20 0930 _0  (1.651 m)  Head Circumference --      Peak Flow --      Pain Score 09/05/20 0930 10     Pain Loc --      Pain Edu? --      Excl. in [place]? --    No data found.  Updated Vital Signs BP 111/87 (BP Location: Left Arm)   Pulse 88   Temp 98.1 F (36.7 C) (Oral)   Resp 14   Ht _0  (1.651 m)   Wt 58.1 kg   SpO2 100%   BMI 21.30 kg/m   Visual Acuity Right Eye Distance:   Left Eye Distance:   Bilateral Distance:    Right Eye Near:   Left Eye Near:    Bilateral Near:     Physical Exam Vitals and nursing note reviewed.  Constitutional:      General: She is not in acute distress.    Appearance: Normal appearance. She is not toxic-appearing or diaphoretic.  HENT:     Head: Normocephalic and atraumatic.     Right Ear: Tympanic membrane normal.     Left Ear: Tympanic membrane normal.     Nose: Congestion present. No rhinorrhea.     Mouth/Throat:     Mouth: Mucous membranes are moist.     Pharynx: Posterior oropharyngeal erythema present. No oropharyngeal exudate.  Eyes:     General: No scleral icterus.       Right eye: No discharge.        Left eye: No discharge.     Extraocular Movements: Extraocular movements intact.     Conjunctiva/sclera: Conjunctivae normal.     Pupils: Pupils are equal, round, and reactive to light.  Cardiovascular:     Rate and Rhythm: Normal rate and regular rhythm.     Pulses: Normal pulses.     Heart sounds: Normal heart sounds. No murmur heard. No  friction rub. No gallop.   Pulmonary:     Effort: Pulmonary effort is normal. No respiratory distress.     Breath sounds: Normal breath sounds. No stridor. No wheezing, rhonchi or rales.  Musculoskeletal:     Cervical back: Normal range of motion and neck supple. No rigidity or tenderness.  Lymphadenopathy:     Cervical: No cervical adenopathy.  Skin:    General: Skin is warm and dry.     Capillary Refill: Capillary refill takes less than 2 seconds.     Coloration: Skin is not jaundiced.     Findings: No bruising, erythema, lesion or rash.  Neurological:     General: No focal deficit present.     Mental Status: She is alert and oriented to person, place, and time.  Psychiatric:        Mood and Affect: Mood normal.      UC Treatments / Results  Labs (all labs ordered are listed, but only abnormal results are displayed) Labs Reviewed  RESP PANEL BY RT-PCR (FLU A&B, COVID) ARPGX2 - Abnormal; Notable for the following components:      Result Value   SARS Coronavirus 2 by RT PCR POSITIVE (*)    All other components within normal limits    EKG   Radiology No results found.  Procedures Procedures (including critical care time)  Medications Ordered in UC Medications - No data to display  Initial Impression / Assessment and Plan / UC Course  I have reviewed the triage vital signs and the nursing notes.  Pertinent labs & imaging results that were available during  my care of the patient were reviewed by me and considered in my medical decision making (see chart for details).   Clinical impression: URI symptoms including myalgia, fever, chills, headache, diaphoresis, cough, congestion, mild nausea since yesterday.  She is not vaccinated.  Treatment plan: 1.  The findings and treatment plan were discussed in detail with the patient.  Patient was in agreement. 2.  Recommended getting a respiratory panel.  She was positive for COVID.  Negative for influenza. 3.  Educational  handouts were provided. 4.  Plenty of rest, plenty of fluids, Tylenol or Motrin for any fever or discomfort. 5.  Provided a work note she needs to quarantine for least 5 days per the current CDC guidelines. 6.  She does not have significant comorbidities so I will not prescribe the new oral COVID medications.  Just supportive care at home. 7.  We will send in medication for cough.  No inhaler if she is not wheezing. 8.  If symptoms persist she should see her PCP. 9.  If symptoms worsen she should go to the ER. 10.  She was discharged in stable condition and will follow-up here as needed.    Final Clinical Impressions(s) / UC Diagnoses   Final diagnoses:  COVID-19  Febrile illness, acute  Cough  Nasal congestion  Headache due to viral infection     Discharge Instructions     You are positive for COVID, negative for influenza. Please see educational handouts. I sent in a medication for your cough. I provided you a work note keeping her out of work for least 5 days per the current CDC guidelines.  You do need to quarantine. Plenty of rest, plenty fluids, Tylenol or Motrin for any fever or discomfort. If symptoms persist please see your PCP. If symptoms worsen please go to the ER.    ED Prescriptions    Medication Sig Dispense Auth. Provider   brompheniramine-pseudoephedrine-DM 30-2-10 MG/5ML syrup Take 5 mLs by mouth 4 (four) times daily as needed. 120 mL Verda Cumins, MD     PDMP not reviewed this encounter.   Verda Cumins, MD 09/05/20 1029

## 2020-09-06 ENCOUNTER — Telehealth: Payer: Self-pay | Admitting: Nurse Practitioner

## 2020-09-06 NOTE — Telephone Encounter (Signed)
Called to discuss with patient about COVID-19 symptoms and the use of one of the available treatments for those with mild to moderate Covid symptoms and at a high risk of hospitalization.  Pt appears to qualify for outpatient treatment due to co-morbid conditions and/or a member of an at-risk group in accordance with the FDA Emergency Use Authorization.    Symptom onset: 09/04/20 Vaccinated: No Booster? No Immunocompromised? No Qualifiers: asthma, smoker NIH Criteria: 3  Unable to reach pt - Voicemail and Mychart message sent. No recent labs on file.   Willette Alma, NP COVID Treatment Team [phone]

## 2020-10-11 DIAGNOSIS — S61412A Laceration without foreign body of left hand, initial encounter: Secondary | ICD-10-CM | POA: Diagnosis not present

## 2020-10-14 DIAGNOSIS — S61412D Laceration without foreign body of left hand, subsequent encounter: Secondary | ICD-10-CM | POA: Diagnosis not present

## 2020-10-14 DIAGNOSIS — Z5189 Encounter for other specified aftercare: Secondary | ICD-10-CM | POA: Diagnosis not present

## 2020-12-23 ENCOUNTER — Other Ambulatory Visit: Payer: Self-pay

## 2020-12-23 ENCOUNTER — Encounter: Payer: Self-pay | Admitting: Family Medicine

## 2020-12-23 ENCOUNTER — Ambulatory Visit (INDEPENDENT_AMBULATORY_CARE_PROVIDER_SITE_OTHER): Payer: BC Managed Care – PPO | Admitting: Family Medicine

## 2020-12-23 VITALS — BP 102/64 | HR 85 | Temp 98.1°F | Resp 16 | Ht 65.0 in | Wt 125.7 lb

## 2020-12-23 DIAGNOSIS — E785 Hyperlipidemia, unspecified: Secondary | ICD-10-CM | POA: Diagnosis not present

## 2020-12-23 DIAGNOSIS — F325 Major depressive disorder, single episode, in full remission: Secondary | ICD-10-CM

## 2020-12-23 DIAGNOSIS — Z8349 Family history of other endocrine, nutritional and metabolic diseases: Secondary | ICD-10-CM

## 2020-12-23 DIAGNOSIS — K219 Gastro-esophageal reflux disease without esophagitis: Secondary | ICD-10-CM

## 2020-12-23 DIAGNOSIS — J45909 Unspecified asthma, uncomplicated: Secondary | ICD-10-CM

## 2020-12-23 DIAGNOSIS — J209 Acute bronchitis, unspecified: Secondary | ICD-10-CM

## 2020-12-23 DIAGNOSIS — Z0000 Encounter for general adult medical examination without abnormal findings: Secondary | ICD-10-CM | POA: Diagnosis not present

## 2020-12-23 LAB — CBC WITH DIFFERENTIAL/PLATELET
Absolute Monocytes: 454 cells/uL (ref 200–950)
Basophils Absolute: 28 cells/uL (ref 0–200)
Basophils Relative: 0.4 %
Eosinophils Absolute: 71 cells/uL (ref 15–500)
Eosinophils Relative: 1 %
HCT: 43.1 % (ref 35.0–45.0)
Hemoglobin: 13.9 g/dL (ref 11.7–15.5)
Lymphs Abs: 1782 cells/uL (ref 850–3900)
MCH: 28.9 pg (ref 27.0–33.0)
MCHC: 32.3 g/dL (ref 32.0–36.0)
MCV: 89.6 fL (ref 80.0–100.0)
MPV: 9.6 fL (ref 7.5–12.5)
Monocytes Relative: 6.4 %
Neutro Abs: 4764 cells/uL (ref 1500–7800)
Neutrophils Relative %: 67.1 %
Platelets: 287 10*3/uL (ref 140–400)
RBC: 4.81 10*6/uL (ref 3.80–5.10)
RDW: 12.7 % (ref 11.0–15.0)
Total Lymphocyte: 25.1 %
WBC: 7.1 10*3/uL (ref 3.8–10.8)

## 2020-12-23 LAB — COMPLETE METABOLIC PANEL WITH GFR
AG Ratio: 2.3 (calc) (ref 1.0–2.5)
ALT: 21 U/L (ref 6–29)
AST: 24 U/L (ref 10–30)
Albumin: 4.9 g/dL (ref 3.6–5.1)
Alkaline phosphatase (APISO): 52 U/L (ref 31–125)
BUN: 12 mg/dL (ref 7–25)
CO2: 28 mmol/L (ref 20–32)
Calcium: 9.9 mg/dL (ref 8.6–10.2)
Chloride: 101 mmol/L (ref 98–110)
Creat: 0.73 mg/dL (ref 0.50–0.97)
Globulin: 2.1 g/dL (calc) (ref 1.9–3.7)
Glucose, Bld: 81 mg/dL (ref 65–99)
Potassium: 4.6 mmol/L (ref 3.5–5.3)
Sodium: 137 mmol/L (ref 135–146)
Total Bilirubin: 0.6 mg/dL (ref 0.2–1.2)
Total Protein: 7 g/dL (ref 6.1–8.1)
eGFR: 109 mL/min/{1.73_m2} (ref >=60)

## 2020-12-23 LAB — TSH: TSH: 1.23 mIU/L

## 2020-12-23 LAB — LIPID PANEL
Cholesterol: 182 mg/dL (ref <200)
HDL: 74 mg/dL (ref >=50)
LDL Cholesterol (Calc): 91 mg/dL (calc)
Non-HDL Cholesterol (Calc): 108 mg/dL (calc) (ref <130)
Total CHOL/HDL Ratio: 2.5 (calc) (ref <5.0)
Triglycerides: 76 mg/dL (ref <150)

## 2020-12-23 MED ORDER — ALBUTEROL SULFATE HFA 108 (90 BASE) MCG/ACT IN AERS: 2.0000 | INHALATION_SPRAY | RESPIRATORY_TRACT | 0 refills | Status: DC | PRN

## 2020-12-23 MED ORDER — FLUTICASONE PROPIONATE 50 MCG/ACT NA SUSP: 2.0000 | Freq: Every day | NASAL | 2 refills | Status: DC

## 2020-12-23 NOTE — Progress Notes (Signed)
Patient: Leslie [name], Female    DOB: June 15, 1985, 35 y.o.   MRN: [id number] Danelle Berry, PA-C Visit Date: 12/23/2020  Today's Provider: Danelle Berry, PA-C   Chief Complaint  Patient presents with   Annual Exam   Subjective:   Annual physical exam:  Leslie [name] is a 35 y.o. 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  Diet/nutrition:   well balanced eats pretty healthy Sleep:  sleep is "okay" trouble falling asleep     SDOH Screenings   Alcohol Screen: Low Risk    Last Alcohol Screening Score (AUDIT): 1  Depression (PHQ2-9): Low Risk    PHQ-2 Score: 0  Financial Resource Strain: Not on file  Food Insecurity: No Food Insecurity   Worried About Programme researcher, broadcasting/film/video in the Last Year: Never true   Ran Out of Food in the Last Year: Never true  Housing: Low Risk    Last Housing Risk Score: 0  Physical Activity: Sufficiently Active   Days of Exercise per Week: 7 days   Minutes of Exercise per Session: 60 min  Social Connections: Moderately Isolated   Frequency of Communication with Friends and Family: More than three times a week   Frequency of Social Gatherings with Friends and Family: More than three times a week   Attends Religious Services: Never   Database administrator or Organizations: No   Attends Banker Meetings: Never   Marital Status: Married  Stress: No Stress Concern Present   Feeling of Stress : Not at all  Tobacco Use: High Risk   Smoking Tobacco Use: Every Day   Smokeless Tobacco Use: Never  Transportation Needs: No Transportation Needs   Lack of Transportation (Medical): No   Lack of Transportation (Non-Medical): No     USPSTF grade A and B recommendations - reviewed and addressed today  Depression:  Phq 9 completed today by patient, was reviewed by me with patient in the room PHQ score is neg, pt feels good PHQ 2/9 Scores 12/23/2020  02/03/2020 01/02/2020 12/23/2019  PHQ - 2 Score 0 0 0 0  PHQ- 9 Score 0 - - -   Depression screen [facility] 2/9 12/23/2020 02/03/2020 01/02/2020 12/23/2019 11/12/2019  Decreased Interest 0 0 0 0 0  Down, Depressed, Hopeless 0 0 0 0 0  PHQ - 2 Score 0 0 0 0 0  Altered sleeping 0 - - - 0  Tired, decreased energy 0 - - - 0  Change in appetite 0 - - - 0  Feeling bad or failure about yourself  0 - - - 0  Trouble concentrating 0 - - - 0  Moving slowly or fidgety/restless 0 - - - 0  Suicidal thoughts 0 - - - 0  PHQ-9 Score 0 - - - 0  Difficult doing work/chores Not difficult at all - - - Not difficult at all    Alcohol screening: Flowsheet Row Office Visit from 02/03/2020 in [facility]  AUDIT-C Score 1       Immunizations and Health Maintenance: Health Maintenance  Topic Date Due   Pneumococcal Vaccine 43-16 Years old (1 - PCV) Never done   COVID-19 Vaccine (1) 01/08/2021 (Originally 05/17/1984)   INFLUENZA VACCINE  07/08/2021 (Originally 11/08/2020)   PAP-Cervical Cytology Screening  10/09/2021   PAP SMEAR-Modifier  10/09/2021   TETANUS/TDAP  05/17/2027   Hepatitis C Screening  Completed   HIV Screening  Completed   HPV VACCINES  Aged Out     Hep C Screening: done  STD testing and prevention (HIV/chl/gon/syphilis):  see above, no additional testing desired by pt today  Intimate partner violence: denies  Sexual History/Pain during Intercourse: Married  Menstrual History/LMP/Abnormal Bleeding: IUD, rare spotting, no other concerns No LMP recorded. (Menstrual status: IUD).  Incontinence Symptoms: denies  Breast cancer: not due per age and hx Last Mammogram: *see HM list above BRCA gene screening: none known  Cervical cancer screening: reviewed last results, UTD,due next year Pt denies family hx of cancers - breast, ovarian, uterine, colon:     Osteoporosis:   Discussion on osteoporosis per age, including high calcium and vitamin D supplementation, weight bearing  exercises   Skin cancer:  Hx of skin CA -  NO Discussed atypical lesions   Colorectal cancer:   Colonoscopy is not due per age   Discussed concerning signs and sx of CRC, pt denies change in bowels, melena, hematochezia  Lung cancer:   Low Dose CT Chest recommended if Age 35-80 years, 20 pack-year currently smoking OR have quit w/in 15years. Patient does not qualify.    Social History   Tobacco Use   Smoking status: Former    Packs/day: 0.15    Years: 15.00    Pack years: 2.25    Types: Cigarettes    Quit date: 05/11/2020    Years since quitting: 0.6   Smokeless tobacco: Never  Vaping Use   Vaping Use: Never used  Substance Use Topics   Alcohol use: Yes    Alcohol/week: 7.0 standard drinks    Types: 7 Glasses of wine per week    Comment: 1 glass of wine   Drug use: No    Comment: pt denies during phone interview but UDS was + for marijuana in 2016     [facility] Visit from 02/03/2020 in [facility]  AUDIT-C Score 1       Family History  Problem Relation Age of Onset   Diabetes Mother    Thyroid disease Maternal Aunt    Ovarian cancer Maternal Grandmother    Heart attack Maternal Grandmother    Cervical cancer Maternal Grandmother    Heart attack Maternal Grandfather    Emphysema Paternal Grandmother      Blood pressure/Hypertension: BP Readings from Last 3 Encounters:  12/23/20 102/64  09/05/20 111/87  02/03/20 110/68    Weight/Obesity: Wt Readings from Last 3 Encounters:  12/23/20 125 lb 11.2 oz (57 kg)  09/05/20 128 lb (58.1 kg)  02/03/20 132 lb 3.2 oz (60 kg)   BMI Readings from Last 3 Encounters:  12/23/20 20.92 kg/m  09/05/20 21.30 kg/m  02/03/20 22.00 kg/m     Lipids:  Lab Results  Component Value Date   CHOL 205 (H) 12/23/2019   CHOL 189 09/09/2018   Lab Results  Component Value Date   HDL 64 12/23/2019   HDL 67 09/09/2018   Lab Results  Component Value Date   LDLCALC 124 (H) 12/23/2019    LDLCALC 106 (H) 09/09/2018   Lab Results  Component Value Date   TRIG 78 12/23/2019   TRIG 73 09/09/2018   Lab Results  Component Value Date   CHOLHDL 3.2 12/23/2019   CHOLHDL 2.8 09/09/2018   No results found for: LDLDIRECT Based on the results of lipid panel his/her cardiovascular risk factor ( using [employer] )  in the next 10 years  is: The ASCVD Risk score (Arnett DK, et al., 2019) failed to calculate for the following reasons:   The 2019 ASCVD risk score is only valid for ages 77 to 10  Glucose:  Glucose  Date Value Ref Range Status  01/09/2014 81 65 - 99 mg/dL Final  05/07/2013 93 65 - 99 mg/dL Final  04/11/2013 74 65 - 99 mg/dL Final   Glucose, Bld  Date Value Ref Range Status  12/23/2019 87 65 - 99 mg/dL Final    Comment:    .            Fasting reference interval .   09/09/2018 92 65 - 99 mg/dL Final    Comment:    .            Fasting reference interval .     Advanced Care Planning:  A voluntary discussion about advance care planning including the explanation and discussion of advance directives.     Social History       Social History   Socioeconomic History   Marital status: Married    Spouse name: Metro Kung   Number of children: 2   Years of education: 11   Highest education level: 11th grade  Occupational History   Not on file  Tobacco Use   Smoking status: Former    Packs/day: 0.15    Years: 15.00    Pack years: 2.25    Types: Cigarettes    Quit date: 05/11/2020    Years since quitting: 0.6   Smokeless tobacco: Never  Vaping Use   Vaping Use: Never used  Substance and Sexual Activity   Alcohol use: Yes    Alcohol/week: 7.0 standard drinks    Types: 7 Glasses of wine per week    Comment: 1 glass of wine   Drug use: No    Comment: pt denies during phone interview but UDS was + for marijuana in 2016   Sexual activity: Yes    Birth control/protection: I.U.D.  Other Topics Concern   Not on file  Social History Narrative   ** Merged  History Encounter **       Social Determinants of Health   Financial Resource Strain: Not on file  Food Insecurity: No Food Insecurity   Worried About Charity fundraiser in the Last Year: Never true   Arboriculturist in the Last Year: Never true  Transportation Needs: No Transportation Needs   Lack of Transportation (Medical): No   Lack of Transportation (Non-Medical): No  Physical Activity: Sufficiently Active   Days of Exercise per Week: 7 days   Minutes of Exercise per Session: 60 min  Stress: No Stress Concern Present   Feeling of Stress : Not at all  Social Connections: Moderately Isolated   Frequency of Communication with Friends and Family: More than three times a week   Frequency of Social Gatherings with Friends and Family: More than three times a week   Attends Religious Services: Never   Marine scientist or Organizations: No   Attends Music therapist: Never   Marital Status: Married    Family History        Family History  Problem Relation Age of Onset   Diabetes Mother    Thyroid disease Maternal Aunt    Ovarian cancer Maternal Grandmother    Heart attack Maternal Grandmother    Cervical cancer Maternal Grandmother    Heart attack Maternal Grandfather    Emphysema Paternal Grandmother  Patient Active Problem List   Diagnosis Date Noted   Hyperlipidemia 12/23/2019   Family history of thyroid disease 12/23/2019   Family history of diabetes mellitus in mother 12/23/2019   Gastroesophageal reflux disease without esophagitis 09/09/2018   Lumbosacral radiculopathy 01/07/2015    Past Surgical History:  Procedure Laterality Date   BREAST SURGERY     augmentation   CESAREAN SECTION  2005   CESAREAN SECTION  2016   CESAREAN SECTION  2005, 2016   CHOLECYSTECTOMY N/A 02/14/2016   Procedure: LAPAROSCOPIC CHOLECYSTECTOMY WITH INTRAOPERATIVE CHOLANGIOGRAM;  Surgeon: Robert Bellow, MD;  Location: [facility] ORS;  Service: General;  Laterality:  N/A;   [place]     Current Outpatient Medications:    albuterol (VENTOLIN HFA) 108 (90 Base) MCG/ACT inhaler, Inhale 2 puffs into the lungs every 4 (four) hours as needed for wheezing or shortness of breath., Disp: 18 g, Rfl: 0   brompheniramine-pseudoephedrine-DM 30-2-10 MG/5ML syrup, Take 5 mLs by mouth 4 (four) times daily as needed., Disp: 120 mL, Rfl: 0   Cetirizine HCl (ZYRTEC ALLERGY) 10 MG TBDP, Take 10 mg by mouth at bedtime., Disp: 30 tablet, Rfl: 0   fluticasone (FLONASE) 50 MCG/ACT nasal spray, Place 2 sprays into both nostrils daily., Disp: 16 g, Rfl: 2   ibuprofen (ADVIL) 600 MG tablet, Take 1 tablet (600 mg total) by mouth every 6 (six) hours as needed., Disp: 30 tablet, Rfl: 0   levonorgestrel (MIRENA) 20 MCG/24HR IUD, 1 each by Intrauterine route once., Disp: , Rfl:   Allergies  Allergen Reactions   Amoxicillin Hives    Has patient had a PCN reaction causing immediate rash, facial/tongue/throat swelling, SOB or lightheadedness with hypotension: {no Has patient had a PCN reaction causing severe rash involving mucus membranes or skin necrosis: no Has patient had a PCN reaction that required hospitalization no Has patient had a PCN reaction occurring within the last 10 years: no If all of the above answers are "NO", then may proceed with Cephalosporin use.   Bactrim [Sulfamethoxazole-Trimethoprim] Hives   Penicillins Hives    Has patient had a PCN reaction causing immediate rash, facial/tongue/throat swelling, SOB or lightheadedness with hypotension: {no Has patient had a PCN reaction causing severe rash involving mucus membranes or skin necrosis: {no Has patient had a PCN reaction that required hospitalization no Has patient had a PCN reaction occurring within the last 10 years: {no If all of the above answers are "NO", then may proceed with Cephalosporin use.   Tape Rash    PT TOLERATES PAPER TAPE WELL     Patient Care Team: Delsa Grana, PA-C as PCP - General (Family Medicine) Nathaneil Canary, PA-C (Physician Assistant) Audie Pinto Shari Heritage, MD as Referring Physician (Family Medicine) Bary Castilla, Forest Gleason, MD (General Surgery) Polanco, Shari Heritage, MD as Referring Physician (Family Medicine) Bary Castilla Forest Gleason, MD (General Surgery)   Chart Review: I personally reviewed active problem list, medication list, allergies, family history, social history, health maintenance, notes from last encounter, lab results, imaging with the patient/caregiver today.   Review of Systems  Constitutional: Negative.   HENT: Negative.    Eyes: Negative.   Respiratory: Negative.    Cardiovascular: Negative.   Gastrointestinal: Negative.   Endocrine: Negative.   Genitourinary: Negative.   Musculoskeletal: Negative.   Skin: Negative.   Allergic/Immunologic: Negative.   Neurological: Negative.   Hematological: Negative.   Psychiatric/Behavioral: Negative.    All other systems reviewed  and are negative.        Objective:   Vitals:  Vitals:   12/23/20 1135  BP: 102/64  Pulse: 85  Resp: 16  Temp: 98.1 F (36.7 C)  SpO2: 98%  Weight: 125 lb 11.2 oz (57 kg)  Height: $Remove'5\' 5"'EPNHSxc$  (1.651 m)    Body mass index is 20.92 kg/m.  Physical Exam Vitals and nursing note reviewed.  Constitutional:      General: She is not in acute distress.    Appearance: Normal appearance. She is well-developed. She is not ill-appearing, toxic-appearing or diaphoretic.     Interventions: Face mask in place.  HENT:     Head: Normocephalic and atraumatic.     Right Ear: Tympanic membrane, ear canal and external ear normal. There is no impacted cerumen.     Left Ear: Tympanic membrane, ear canal and external ear normal. There is no impacted cerumen.     Nose: Congestion and rhinorrhea present.     Mouth/Throat:     Mouth: Mucous membranes are moist.     Pharynx: Oropharynx is clear. Posterior oropharyngeal erythema  present. No oropharyngeal exudate.  Eyes:     General: Lids are normal. No scleral icterus.       Right eye: No discharge.        Left eye: No discharge.     Conjunctiva/sclera: Conjunctivae normal.     Pupils: Pupils are equal, round, and reactive to light.  Neck:     Trachea: Phonation normal. No tracheal deviation.  Cardiovascular:     Rate and Rhythm: Normal rate and regular rhythm.     Pulses: Normal pulses.          Radial pulses are 2+ on the right side and 2+ on the left side.       Posterior tibial pulses are 2+ on the right side and 2+ on the left side.     Heart sounds: Normal heart sounds. No murmur heard.   No friction rub. No gallop.  Pulmonary:     Effort: Pulmonary effort is normal. No respiratory distress.     Breath sounds: Normal breath sounds. No stridor. No wheezing, rhonchi or rales.  Chest:     Chest wall: No tenderness.  Abdominal:     General: Bowel sounds are normal. There is no distension.     Palpations: Abdomen is soft.     Tenderness: There is no abdominal tenderness. There is no right CVA tenderness or left CVA tenderness.  Musculoskeletal:     Cervical back: Normal range of motion.     Right lower leg: No edema.     Left lower leg: No edema.  Lymphadenopathy:     Cervical: No cervical adenopathy.  Skin:    General: Skin is warm and dry.     Capillary Refill: Capillary refill takes less than 2 seconds.     Coloration: Skin is not jaundiced or pale.     Findings: No rash.  Neurological:     Mental Status: She is alert. Mental status is at baseline.     Motor: No abnormal muscle tone.     Gait: Gait normal.  Psychiatric:        Mood and Affect: Mood normal.        Speech: Speech normal.        Behavior: Behavior normal.      Fall Risk: Fall Risk  12/23/2020 02/03/2020 01/02/2020 12/23/2019 11/12/2019  Falls in the past year? 0 1 1  1 1  Number falls in past yr: 0 0 0 1 0  Injury with Fall? 0 $Remov'1 1 1 1  'IzigYn$ Risk for fall due to : - History of  fall(s) - - -  Follow up - - Falls evaluation completed Falls evaluation completed -    Functional Status Survey: Is the patient deaf or have difficulty hearing?: No Does the patient have difficulty seeing, even when wearing glasses/contacts?: Yes Does the patient have difficulty concentrating, remembering, or making decisions?: No Does the patient have difficulty walking or climbing stairs?: No Does the patient have difficulty dressing or bathing?: No Does the patient have difficulty doing errands alone such as visiting a doctor's office or shopping?: No   Assessment & Plan:    CPE completed today  USPSTF grade A and B recommendations reviewed with patient; age-appropriate recommendations, preventive care, screening tests, etc discussed and encouraged; healthy living encouraged; see AVS for patient education given to patient  Discussed importance of 150 minutes of physical activity weekly, AHA exercise recommendations given to pt in AVS/handout  Discussed importance of healthy diet:  eating lean meats and proteins, avoiding trans fats and saturated fats, avoid simple sugars and excessive carbs in diet, eat 6 servings of fruit/vegetables daily and drink plenty of water and avoid sweet beverages.    Recommended pt to do annual eye exam and routine dental exams/cleanings  Depression, alcohol, fall screening completed as documented above and per flowsheets  Advance Care planning information and packet discussed and offered today, encouraged pt to discuss with family members/spouse/partner/friends and complete Advanced directive packet and bring copy to office   Reviewed Health Maintenance: Health Maintenance  Topic Date Due   Pneumococcal Vaccine 74-72 Years old (1 - PCV) Never done   COVID-19 Vaccine (1) 01/08/2021 (Originally 05/17/1984)   INFLUENZA VACCINE  07/08/2021 (Originally 11/08/2020)   PAP-Cervical Cytology Screening  10/09/2021   PAP SMEAR-Modifier  10/09/2021   TETANUS/TDAP   05/17/2027   Hepatitis C Screening  Completed   HIV Screening  Completed   HPV VACCINES  Aged Out    Immunizations: Immunization History  Administered Date(s) Administered   Td 08/29/2009   Tdap 11/02/2015, 05/16/2017   Vaccines:  HPV: up to at age 35 , ask insurance if age between 60-45  Shingrix: 37-64 yo and ask insurance if covered when patient above 43 yo Pneumonia:  educated and discussed with patient. Flu:  educated and discussed with patient. COVID:      ICD-10-CM   1. Annual physical exam  Z00.00 TSH    COMPLETE METABOLIC PANEL WITH GFR    CBC w/Diff/Platelet    Lipid panel    2. Gastroesophageal reflux disease without esophagitis  K21.9    improved sx w/o meds and with diet/lifestyle changes    3. Family history of thyroid disease  Z83.49 TSH    4. Hyperlipidemia, unspecified hyperlipidemia type  E78.5 Lipid panel   known elevated cholesterol, not on meds    5. Major depressive disorder in full remission, unspecified whether recurrent ([place]) Chronic F32.5    mood good- not on meds, phq reviewed and neg, in remission    6. Bronchitis with asthma, subacute  J20.9    J45.909    recurrent, esp in fall, refill on inhalers and allergy meds in case she has another flare, doing much better than last year, stopped smoking, lungs clear          Delsa Grana, PA-C 12/23/20 12:13 PM  [facility]

## 2020-12-23 NOTE — Patient Instructions (Signed)
Preventive Care 37-37 Years Old, Female Preventive care refers to lifestyle choices and visits with your health care provider that can promote health and wellness. This includes: A yearly physical exam. This is also called an annual wellness visit. Regular dental and eye exams. Immunizations. Screening for certain conditions. Healthy lifestyle choices, such as: Eating a healthy diet. Getting regular exercise. Not using drugs or products that contain nicotine and tobacco. Limiting alcohol use. What can I expect for my preventive care visit? Physical exam Your health care provider will check your: Height and weight. These may be used to calculate your BMI (body mass index). BMI is a measurement that tells if you are at a healthy weight. Heart rate and blood pressure. Body temperature. Skin for abnormal spots. Counseling Your health care provider may ask you questions about your: Past medical problems. Family's medical history. Alcohol, tobacco, and drug use. Emotional well-being. Home life and relationship well-being. Sexual activity. Diet, exercise, and sleep habits. Work and work environment. Access to firearms. Method of birth control. Menstrual cycle. Pregnancy history. What immunizations do I need? Vaccines are usually given at various ages, according to a schedule. Your health care provider will recommend vaccines for you based on your age, medical history, and lifestyle or other factors, such as travel or where you work. What tests do I need? Blood tests Lipid and cholesterol levels. These may be checked every 5 years, or more often if you are over 37 years old. Hepatitis C test. Hepatitis B test. Screening Lung cancer screening. You may have this screening every year starting at age 37 if you have a 30-pack-year history of smoking and currently smoke or have quit within the past 15 years. Colorectal cancer screening. All adults should have this screening starting at  age 50 and continuing until age 75. Your health care provider may recommend screening at age 45 if you are at increased risk. You will have tests every 1-10 years, depending on your results and the type of screening test. Diabetes screening. This is done by checking your blood sugar (glucose) after you have not eaten for a while (fasting). You may have this done every 1-3 years. Mammogram. This may be done every 1-2 years. Talk with your health care provider about when you should start having regular mammograms. This may depend on whether you have a family history of breast cancer. BRCA-related cancer screening. This may be done if you have a family history of breast, ovarian, tubal, or peritoneal cancers. Pelvic exam and Pap test. This may be done every 3 years starting at age 21. Starting at age 30, this may be done every 5 years if you have a Pap test in combination with an HPV test. Other tests STD (sexually transmitted disease) testing, if you are at risk. Bone density scan. This is done to screen for osteoporosis. You may have this scan if you are at high risk for osteoporosis. Talk with your health care provider about your test results, treatment options, and if necessary, the need for more tests. Follow these instructions at home: Eating and drinking  Eat a diet that includes fresh fruits and vegetables, whole grains, lean protein, and low-fat dairy products. Take vitamin and mineral supplements as recommended by your health care provider. Do not drink alcohol if: Your health care provider tells you not to drink. You are pregnant, may be pregnant, or are planning to become pregnant. If you drink alcohol: Limit how much you have to 0-1 drink a day. Be   aware of how much alcohol is in your drink. In the [place] one drink equals one 12 oz bottle of beer (355 mL), one 5 oz glass of wine (148 mL), or one 1 oz glass of hard liquor (44 mL). Lifestyle Take daily care of your teeth and  gums. Brush your teeth every morning and night with fluoride toothpaste. Floss one time each day. Stay active. Exercise for at least 30 minutes 5 or more days each week. Do not use any products that contain nicotine or tobacco, such as cigarettes, e-cigarettes, and chewing tobacco. If you need help quitting, ask your health care provider. Do not use drugs. If you are sexually active, practice safe sex. Use a condom or other form of protection to prevent STIs (sexually transmitted infections). If you do not wish to become pregnant, use a form of birth control. If you plan to become pregnant, see your health care provider for a prepregnancy visit. If told by your health care provider, take low-dose aspirin daily starting at age 37. Find healthy ways to cope with stress, such as: Meditation, yoga, or listening to music. Journaling. Talking to a trusted person. Spending time with friends and family. Safety Always wear your seat belt while driving or riding in a vehicle. Do not drive: If you have been drinking alcohol. Do not ride with someone who has been drinking. When you are tired or distracted. While texting. Wear a helmet and other protective equipment during sports activities. If you have firearms in your house, make sure you follow all gun safety procedures. What's next? Visit your health care provider once a year for an annual wellness visit. Ask your health care provider how often you should have your eyes and teeth checked. Stay up to date on all vaccines. This information is not intended to replace advice given to you by your health care provider. Make sure you discuss any questions you have with your health care provider. Document Revised: 06/04/2020 Document Reviewed: 12/06/2017 Elsevier Patient Education  2022 [employer].

## 2021-02-02 DIAGNOSIS — M5416 Radiculopathy, lumbar region: Secondary | ICD-10-CM | POA: Diagnosis not present

## 2021-02-02 DIAGNOSIS — M545 Low back pain, unspecified: Secondary | ICD-10-CM | POA: Diagnosis not present

## 2021-03-22 ENCOUNTER — Encounter: Payer: Self-pay | Admitting: Family Medicine

## 2021-03-31 ENCOUNTER — Other Ambulatory Visit: Payer: Self-pay | Admitting: Family Medicine

## 2021-03-31 NOTE — Telephone Encounter (Signed)
Requested Prescriptions  Pending Prescriptions Disp Refills   fluticasone (FLONASE) 50 MCG/ACT nasal spray [Pharmacy Med Name: FLUTICASONE PROP 50 MCG SPRAY] 16 mL 2    Sig: SPRAY 2 SPRAYS INTO EACH NOSTRIL EVERY DAY     Ear, Nose, and Throat: Nasal Preparations - Corticosteroids Passed - 03/31/2021  1:31 AM      Passed - Valid encounter within last 12 months    Recent Outpatient Visits          3 months ago Annual physical exam   [facility] Danelle Berry, PA-C   1 year ago Bronchitis with asthma, subacute   [facility] Danelle Berry, PA-C   1 year ago Cough   [facility] Danelle Berry, PA-C   1 year ago Annual physical exam   [facility] Danelle Berry, PA-C   1 year ago Viral upper respiratory tract infection   [facility] Jamelle Haring, MD

## 2021-04-09 ENCOUNTER — Ambulatory Visit
Admission: EM | Admit: 2021-04-09 | Discharge: 2021-04-09 | Disposition: A | Discharge status: Home / Self Care | Payer: BC Managed Care – PPO | Attending: Emergency Medicine | Admitting: Emergency Medicine

## 2021-04-09 ENCOUNTER — Other Ambulatory Visit: Payer: Self-pay

## 2021-04-09 ENCOUNTER — Encounter: Payer: Self-pay | Admitting: Emergency Medicine

## 2021-04-09 DIAGNOSIS — K859 Acute pancreatitis without necrosis or infection, unspecified: Secondary | ICD-10-CM | POA: Diagnosis not present

## 2021-04-09 DIAGNOSIS — Z9049 Acquired absence of other specified parts of digestive tract: Secondary | ICD-10-CM | POA: Diagnosis not present

## 2021-04-09 DIAGNOSIS — R1013 Epigastric pain: Secondary | ICD-10-CM | POA: Diagnosis not present

## 2021-04-09 DIAGNOSIS — Z87891 Personal history of nicotine dependence: Secondary | ICD-10-CM | POA: Diagnosis not present

## 2021-04-09 LAB — CBC WITH DIFFERENTIAL/PLATELET
Abs Immature Granulocytes: 0.04 10*3/uL (ref 0.00–0.07)
Basophils Absolute: 0.1 10*3/uL (ref 0.0–0.1)
Basophils Relative: 1 %
Eosinophils Absolute: 0.1 10*3/uL (ref 0.0–0.5)
Eosinophils Relative: 1 %
HCT: 44.1 % (ref 36.0–46.0)
Hemoglobin: 14.1 g/dL (ref 12.0–15.0)
Immature Granulocytes: 0 %
Lymphocytes Relative: 11 %
Lymphs Abs: 1 10*3/uL (ref 0.7–4.0)
MCH: 29.4 pg (ref 26.0–34.0)
MCHC: 32 g/dL (ref 30.0–36.0)
MCV: 91.9 fL (ref 80.0–100.0)
Monocytes Absolute: 0.4 10*3/uL (ref 0.1–1.0)
Monocytes Relative: 4 %
Neutro Abs: 8 10*3/uL — ABNORMAL HIGH (ref 1.7–7.7)
Neutrophils Relative %: 83 %
Platelets: 304 10*3/uL (ref 150–400)
RBC: 4.8 MIL/uL (ref 3.87–5.11)
RDW: 13.1 % (ref 11.5–15.5)
WBC: 9.6 10*3/uL (ref 4.0–10.5)
nRBC: 0 % (ref 0.0–0.2)

## 2021-04-09 LAB — URINALYSIS, COMPLETE (UACMP) WITH MICROSCOPIC
Bilirubin Urine: NEGATIVE
Glucose, UA: NEGATIVE mg/dL
Hgb urine dipstick: NEGATIVE
Ketones, ur: NEGATIVE mg/dL
Leukocytes,Ua: NEGATIVE
Nitrite: NEGATIVE
Protein, ur: NEGATIVE mg/dL
Specific Gravity, Urine: 1.02 (ref 1.005–1.030)
pH: 7 (ref 5.0–8.0)

## 2021-04-09 LAB — COMPREHENSIVE METABOLIC PANEL
ALT: 33 U/L (ref 0–44)
AST: 29 U/L (ref 15–41)
Albumin: 4.6 g/dL (ref 3.5–5.0)
Alkaline Phosphatase: 46 U/L (ref 38–126)
Anion gap: 6 (ref 5–15)
BUN: 16 mg/dL (ref 6–20)
CO2: 29 mmol/L (ref 22–32)
Calcium: 9.7 mg/dL (ref 8.9–10.3)
Chloride: 105 mmol/L (ref 98–111)
Creatinine, Ser: 0.77 mg/dL (ref 0.44–1.00)
GFR, Estimated: >60 mL/min (ref >60)
Glucose, Bld: 110 mg/dL — ABNORMAL HIGH (ref 70–99)
Potassium: 4.7 mmol/L (ref 3.5–5.1)
Sodium: 140 mmol/L (ref 135–145)
Total Bilirubin: 0.6 mg/dL (ref 0.3–1.2)
Total Protein: 7.5 g/dL (ref 6.5–8.1)

## 2021-04-09 LAB — LIPASE, BLOOD: Lipase: 171 U/L — ABNORMAL HIGH (ref 11–51)

## 2021-04-09 MED ORDER — HYDROCODONE-ACETAMINOPHEN 5-325 MG PO TABS: 1.0000 | ORAL_TABLET | Freq: Four times a day (QID) | ORAL | 0 refills | Status: DC | PRN

## 2021-04-09 MED ORDER — ONDANSETRON 8 MG PO TBDP: 8.0000 mg | ORAL_TABLET | Freq: Three times a day (TID) | ORAL | 0 refills | Status: DC | PRN

## 2021-04-09 NOTE — ED Triage Notes (Signed)
Pt c/o abdominal pain, at the top of the abdomen. Nausea and sharp pain.

## 2021-04-09 NOTE — Discharge Instructions (Addendum)
Your lab work did not show the presence of infection but it does show an elevated lipase which indicates that your pancreas is inflamed and could be the source of your pain.  There are no abnormalities to your liver enzymes, kidney function, or calcium levels which would be concerning for possible admission.  Use Zofran every 8 hours as needed for nausea.  Follow a clear liquid diet until your pain has resolved.  Bowel rest is important to decrease pancreatic inflammation.  Use over-the-counter Tylenol and ibuprofen as needed for mild to moderate pain and use the Norco as needed for severe pain.  Be mindful that the Norco has Tylenol in it so be mindful of how much Tylenol you consume.  Make sure that you do not consume more than 4000 mg in 24 hours.  The Norco will also sedate you, so do not drink alcohol or drive while you take it.  If your pain increases, you develop a fever, or you develop nausea and vomiting where you are unable to keep down medication or fluids you need to go to the ER for evaluation.

## 2021-04-09 NOTE — ED Provider Notes (Signed)
[facility]    CSN: [id number] Arrival date & time: 04/09/21  0810      History   Chief Complaint Chief Complaint  Patient presents with   Abdominal Pain    HPI Leslie [name] is a 35 y.o. female.   HPI  35 year old female here for evaluation of abdominal pain.  Patient reports that she has been experiencing upper abdominal and epigastric pain that will occasionally radiate through to her back for the last few weeks.  She reports that the pain became significantly worse last night.  She reports that she is decreased her meal consumption to 1 meal a day because her pain is increased with food consumption.  She has been drinking water and she is not drinking her morning cup of coffee.  She reports that she has had mucousy diarrhea as well.  She denies fevers.  This morning she had some pretty significant nausea.  She does have a history of gastroesophageal reflux disease with esophagitis.  She does not have a gallbladder any longer and that was removed in 2017.  She reports that she drinks 1 glass of wine a night but does not binge drink at all and her caffeine consumption is limited to 1 cup of coffee each morning.  She is not currently on any PPIs for control of her GERD.  Past Medical History:  Diagnosis Date   Anxiety    Back injury    Bulging lumbar disc    Cholecystitis, chronic 02/23/2016   Complication of anesthesia    Current moderate episode of major depressive disorder without prior episode (HCC) 09/09/2018   Family history of adverse reaction to anesthesia    N/V-Mom   GERD (gastroesophageal reflux disease)    Headache    H/O   PONV (postoperative nausea and vomiting)     Patient Active Problem List   Diagnosis Date Noted   Hyperlipidemia 12/23/2019   Family history of thyroid disease 12/23/2019   Family history of diabetes mellitus in mother 12/23/2019   Gastroesophageal reflux disease without esophagitis 09/09/2018   Lumbosacral radiculopathy  01/07/2015    Past Surgical History:  Procedure Laterality Date   BREAST SURGERY     augmentation   CESAREAN SECTION  2005   CESAREAN SECTION  2016   CESAREAN SECTION  2005, 2016   CHOLECYSTECTOMY N/A 02/14/2016   Procedure: LAPAROSCOPIC CHOLECYSTECTOMY WITH INTRAOPERATIVE CHOLANGIOGRAM;  Surgeon: Earline Mayotte, MD;  Location: [facility] ORS;  Service: General;  Laterality: N/A;   TONSILLECTOMY AND ADENOIDECTOMY  1987   TONSILLECTOMY AND ADENOIDECTOMY  1987    OB History     Gravida  5   Para  2   Term  0   Preterm  0   AB  2   Living         SAB  1   IAB  0   Ectopic  0   Multiple      Live Births           Obstetric Comments  1st Menstrual Cycle:  10  1st Pregnancy:  17  1st Menstrual Cycle:  10 1st Pregnancy:  17           Home Medications    Prior to Admission medications   Medication Sig Start Date End Date Taking? Authorizing Provider  fluticasone (FLONASE) 50 MCG/ACT nasal spray SPRAY 2 SPRAYS INTO EACH NOSTRIL EVERY DAY 03/31/21  Yes Danelle Berry, PA-C  HYDROcodone-acetaminophen (NORCO/VICODIN) 5-325 MG tablet Take 1-2  tablets by mouth every 6 (six) hours as needed. 04/09/21  Yes Margarette Canada, NP  levonorgestrel (MIRENA) 20 MCG/24HR IUD 1 each by Intrauterine route once.   Yes [provider]  ondansetron (ZOFRAN-ODT) 8 MG disintegrating tablet Take 1 tablet (8 mg total) by mouth every 8 (eight) hours as needed for nausea or vomiting. 04/09/21  Yes Margarette Canada, NP  albuterol (VENTOLIN HFA) 108 (90 Base) MCG/ACT inhaler Inhale 2 puffs into the lungs every 4 (four) hours as needed for wheezing or shortness of breath. 12/23/20   Delsa Grana, PA-C  Cetirizine HCl (ZYRTEC ALLERGY) 10 MG TBDP Take 10 mg by mouth at bedtime. 01/02/20   Delsa Grana, PA-C  ibuprofen (ADVIL) 600 MG tablet Take 1 tablet (600 mg total) by mouth every 6 (six) hours as needed. 11/13/19   Melynda Ripple, MD    Family History Family History  Problem Relation Age  of Onset   Diabetes Mother    Thyroid disease Maternal Aunt    Ovarian cancer Maternal Grandmother    Heart attack Maternal Grandmother    Cervical cancer Maternal Grandmother    Heart attack Maternal Grandfather    Emphysema Paternal Grandmother     Social History Social History   Tobacco Use   Smoking status: Former    Packs/day: 0.15    Years: 15.00    Pack years: 2.25    Types: Cigarettes    Quit date: 05/11/2020    Years since quitting: 0.9   Smokeless tobacco: Never  Vaping Use   Vaping Use: Never used  Substance Use Topics   Alcohol use: Yes    Alcohol/week: 7.0 standard drinks    Types: 7 Glasses of wine per week    Comment: 1 glass of wine   Drug use: No    Comment: pt denies during phone interview but UDS was + for marijuana in 2016     Allergies   Amoxicillin, Bactrim [sulfamethoxazole-trimethoprim], Penicillins, and Tape   Review of Systems Review of Systems  Constitutional:  Positive for appetite change. Negative for activity change and fever.  Gastrointestinal:  Positive for abdominal pain, diarrhea and nausea. Negative for blood in stool and vomiting.  Genitourinary:  Negative for dysuria, frequency, hematuria and urgency.  Musculoskeletal:  Positive for back pain.  Skin:  Negative for rash.  Hematological: Negative.   Psychiatric/Behavioral: Negative.      Physical Exam Triage Vital Signs ED Triage Vitals  Enc Vitals Group     BP 04/09/21 0821 (!) 118/91     Pulse Rate 04/09/21 0821 79     Resp 04/09/21 0821 16     Temp 04/09/21 0821 98.4 F (36.9 C)     Temp Source 04/09/21 0821 Oral     SpO2 04/09/21 0821 100 %     Weight 04/09/21 0820 130 lb (59 kg)     Height 04/09/21 0820 5\' 5"  (1.651 m)     Head Circumference --      Peak Flow --      Pain Score 04/09/21 0819 10     Pain Loc --      Pain Edu? --      Excl. in [place]? --    No data found.  Updated Vital Signs BP (!) 118/91 (BP Location: Left Arm)    Pulse 79    Temp 98.4 F (36.9  C) (Oral)    Resp 16    Ht 5\' 5"  (1.651 m)    Wt 130 lb (59  kg)    SpO2 100%    BMI 21.63 kg/m   Visual Acuity Right Eye Distance:   Left Eye Distance:   Bilateral Distance:    Right Eye Near:   Left Eye Near:    Bilateral Near:     Physical Exam Vitals and nursing note reviewed.  Constitutional:      General: She is in acute distress.     Appearance: Normal appearance. She is normal weight. She is not ill-appearing.  HENT:     Head: Normocephalic and atraumatic.  Cardiovascular:     Rate and Rhythm: Normal rate and regular rhythm.     Pulses: Normal pulses.     Heart sounds: Normal heart sounds. No murmur heard.   No friction rub. No gallop.  Pulmonary:     Effort: Pulmonary effort is normal.     Breath sounds: Normal breath sounds. No wheezing, rhonchi or rales.  Abdominal:     General: Abdomen is flat.     Palpations: Abdomen is soft.     Tenderness: There is abdominal tenderness. There is right CVA tenderness, left CVA tenderness and guarding. There is no rebound.  Skin:    General: Skin is warm and dry.     Capillary Refill: Capillary refill takes less than 2 seconds.     Findings: No erythema or rash.  Neurological:     General: No focal deficit present.     Mental Status: She is alert and oriented to person, place, and time.  Psychiatric:        Mood and Affect: Mood normal.        Behavior: Behavior normal.        Thought Content: Thought content normal.        Judgment: Judgment normal.     UC Treatments / Results  Labs (all labs ordered are listed, but only abnormal results are displayed) Labs Reviewed  CBC WITH DIFFERENTIAL/PLATELET - Abnormal; Notable for the following components:      Result Value   Neutro Abs 8.0 (*)    All other components within normal limits  COMPREHENSIVE METABOLIC PANEL - Abnormal; Notable for the following components:   Glucose, Bld 110 (*)    All other components within normal limits  LIPASE, BLOOD - Abnormal; Notable for  the following components:   Lipase 171 (*)    All other components within normal limits  URINALYSIS, COMPLETE (UACMP) WITH MICROSCOPIC - Abnormal; Notable for the following components:   Bacteria, UA FEW (*)    All other components within normal limits    EKG   Radiology No results found.  Procedures Procedures (including critical care time)  Medications Ordered in UC Medications - No data to display  Initial Impression / Assessment and Plan / UC Course  I have reviewed the triage vital signs and the nursing notes.  Pertinent labs & imaging results that were available during my care of the patient were reviewed by me and considered in my medical decision making (see chart for details).  Patient is a pleasant 35 year old female who appears to be in a moderate degree of pain here for evaluation of upper abdominal pain that is been ongoing for the past several weeks but worsened overnight.  The patient reports that her pain does increase when she eats and she has been limiting her meals to 1 meal a day because of such.  She stopped drinking her morning coffee and she has not had any alcohol consumption.  She  is only working water.  Her alcohol consumption consists of 1 glass of wine a night and her caffeine intake consist of 1 cup of coffee each morning.  She does have a history of GERD with esophagitis has not on a PPI at current.  She did have a cholecystectomy in 2017.  She denies fever.  She came in today because she reports that she had a significant amount of nausea and the pain sharply increased.  The pain does occasionally radiate straight through to her back.  Her physical exam consist of a benign cardiopulmonary exam with clear lung sounds in all fields.  She does have mild CVA tenderness on both sides.  For abdomen is flat with marked tenderness in the epigastric region and tenderness with guarding in the left upper quadrant.  Right upper quadrant has mild tenderness.  She also  suprapubic tenderness but no right or left lower quadrant tenderness.  With patient having a cholecystectomy there is a concern that this may be a retained stone in the liver, gastritis with esophagitis, or pancreatitis.  We will check CBC, CMP, lipase, and UA given the suprapubic pain and bilateral CVA tenderness.  CBC is unremarkable.  Urinalysis shows few bacteria but otherwise is unremarkable.  No leukocyte esterase, nitrates, or protein noted.  No WBCs.  CMP shows a mildly elevated glucose of 110 but is otherwise unremarkable.  Transaminases are normal.  Lipase is 171.  Based on patient's lab work and clinical exam she has acute pancreatitis.  We will discharge her home with Zofran to help control nausea, directions to follow clinical diet to rest her bowel until her pain resolves, and Norco as needed for severe pain.  I also asked the patient that if her pain increases, she develops a fever, or vomiting and is unable to keep fluids down she needs to go to the ER for evaluation.  Patient's SIRS score is 0.   Final Clinical Impressions(s) / UC Diagnoses   Final diagnoses:  Acute pancreatitis, unspecified complication status, unspecified pancreatitis type     Discharge Instructions      Your lab work did not show the presence of infection but it does show an elevated lipase which indicates that your pancreas is inflamed and could be the source of your pain.  There are no abnormalities to your liver enzymes, kidney function, or calcium levels which would be concerning for possible admission.  Use Zofran every 8 hours as needed for nausea.  Follow a clear liquid diet until your pain has resolved.  Bowel rest is important to decrease pancreatic inflammation.  Use over-the-counter Tylenol and ibuprofen as needed for mild to moderate pain and use the Norco as needed for severe pain.  Be mindful that the Norco has Tylenol in it so be mindful of how much Tylenol you consume.  Make sure  that you do not consume more than 4000 mg in 24 hours.  The Norco will also sedate you, so do not drink alcohol or drive while you take it.  If your pain increases, you develop a fever, or you develop nausea and vomiting where you are unable to keep down medication or fluids you need to go to the ER for evaluation.       ED Prescriptions     Medication Sig Dispense Auth. Provider   ondansetron (ZOFRAN-ODT) 8 MG disintegrating tablet Take 1 tablet (8 mg total) by mouth every 8 (eight) hours as needed for nausea or vomiting. 20 tablet Margarette Canada,  NP   HYDROcodone-acetaminophen (NORCO/VICODIN) 5-325 MG tablet Take 1-2 tablets by mouth every 6 (six) hours as needed. 25 tablet Margarette Canada, NP      I have reviewed the PDMP during this encounter.   Margarette Canada, NP 04/09/21 1011

## 2021-04-12 ENCOUNTER — Encounter: Payer: Self-pay | Admitting: Internal Medicine

## 2021-04-12 ENCOUNTER — Ambulatory Visit
Admission: RE | Admit: 2021-04-12 | Discharge: 2021-04-12 | Disposition: A | Discharge status: Home / Self Care | Payer: BC Managed Care – PPO | Source: Ambulatory Visit | Attending: Internal Medicine | Admitting: Internal Medicine

## 2021-04-12 ENCOUNTER — Ambulatory Visit: Payer: BC Managed Care – PPO | Admitting: Internal Medicine

## 2021-04-12 ENCOUNTER — Other Ambulatory Visit: Payer: Self-pay | Admitting: Internal Medicine

## 2021-04-12 ENCOUNTER — Other Ambulatory Visit: Payer: Self-pay

## 2021-04-12 VITALS — BP 112/78 | HR 92 | Temp 98.9°F | Resp 16 | Ht 65.0 in | Wt 125.8 lb

## 2021-04-12 DIAGNOSIS — R1013 Epigastric pain: Secondary | ICD-10-CM | POA: Diagnosis not present

## 2021-04-12 DIAGNOSIS — K859 Acute pancreatitis without necrosis or infection, unspecified: Secondary | ICD-10-CM

## 2021-04-12 DIAGNOSIS — Z9049 Acquired absence of other specified parts of digestive tract: Secondary | ICD-10-CM | POA: Diagnosis not present

## 2021-04-12 DIAGNOSIS — I700 Atherosclerosis of aorta: Secondary | ICD-10-CM | POA: Diagnosis not present

## 2021-04-12 DIAGNOSIS — R110 Nausea: Secondary | ICD-10-CM | POA: Diagnosis not present

## 2021-04-12 LAB — COMPLETE METABOLIC PANEL WITH GFR
AG Ratio: 2 (calc) (ref 1.0–2.5)
ALT: 20 U/L (ref 6–29)
AST: 20 U/L (ref 10–30)
Albumin: 4.7 g/dL (ref 3.6–5.1)
Alkaline phosphatase (APISO): 43 U/L (ref 31–125)
BUN: 13 mg/dL (ref 7–25)
CO2: 30 mmol/L (ref 20–32)
Calcium: 10.1 mg/dL (ref 8.6–10.2)
Chloride: 101 mmol/L (ref 98–110)
Creat: 0.85 mg/dL (ref 0.50–0.97)
Globulin: 2.3 g/dL (calc) (ref 1.9–3.7)
Glucose, Bld: 73 mg/dL (ref 65–99)
Potassium: 4.8 mmol/L (ref 3.5–5.3)
Sodium: 138 mmol/L (ref 135–146)
Total Bilirubin: 0.5 mg/dL (ref 0.2–1.2)
Total Protein: 7 g/dL (ref 6.1–8.1)
eGFR: 90 mL/min/{1.73_m2} (ref >=60)

## 2021-04-12 LAB — LIPASE: Lipase: 51 U/L (ref 7–60)

## 2021-04-12 NOTE — Addendum Note (Signed)
Addended by: Margarita Mail on: 04/12/2021 03:58 PM   Modules accepted: Orders

## 2021-04-12 NOTE — Telephone Encounter (Signed)
Copied from CRM [id number]. Topic: General - Other >> Apr 12, 2021  2:28 PM Pawlus, Maxine Glenn A wrote: Reason for CRM: Dani from [facility] [phone] ext [phone]) was calling to advise an authorisation needs to be started for the pts upcoming CT scan, please advise.

## 2021-04-12 NOTE — Telephone Encounter (Signed)
Copied from CRM #[id number]. Topic: General - Other >> Apr 12, 2021  2:28 PM Pawlus, Monica A wrote: Reason for CRM: Dani from [facility] ([phone]) was calling to advise an authorisation needs to be started for the pts upcoming CT scan, please advise. 

## 2021-04-12 NOTE — Patient Instructions (Addendum)
It was great seeing you today!  Plan discussed at today's visit: -Blood work ordered today, results will be uploaded to [facility].  -CT scan ordered, will call you to schedule this test -In the meantime, bowel rest, clear liquids, NO fatty foods, no alcohol -Can use Zofran as needed for nausea -If pain becomes severe, please go to emergency room  Follow up in: 1 week  Take care and let us know if you have any questions or concerns prior to your next visit.  Dr. Rosana Berger   Acute Pancreatitis The pancreas is a gland that is located behind the stomach on the left side of the abdomen. It produces enzymes that help to digest food. The pancreas also releases the hormones glucagon and insulin, which help to regulate blood sugar. Acute pancreatitis happens when inflammation of the pancreas suddenly occurs and the pancreas becomes irritated and swollen. Most acute attacks last a few days and cause serious problems. Some people become dehydrated and develop low blood pressure. In severe cases, bleeding in the abdomen can lead to shock and can be life-threatening. The lungs, heart, and kidneys may fail. What are the causes? This condition may be caused by: Alcohol abuse. Drug abuse. Gallstones or other conditions that can block the tube that drains the pancreas (pancreatic duct). A tumor in the pancreas. Other causes include: Certain medicines. Exposure to certain chemicals. Diabetes. An infection in the pancreas. Damage caused by an accident (trauma). The poison (venom) from a scorpion bite. Abdominal surgery. Autoimmune pancreatitis. This is when the body's disease-fighting (immune) system attacks the pancreas. Genes that are passed from parent to child (inherited). In some cases, the cause of this condition is not known. What are the signs or symptoms? Symptoms of this condition include: Pain in the upper abdomen that may radiate to the back. Pain may be severe. Tenderness and swelling of  the abdomen. Nausea and vomiting. Fever. How is this diagnosed? This condition may be diagnosed based on: A physical exam. Blood tests. Imaging tests, such as X-rays, CT or MRI scans, or an ultrasound of the abdomen. How is this treated? Treatment for this condition usually requires a stay in the hospital. Treatment for this condition may include: Pain medicine. Fluid replacement through an IV. Placing a tube in the stomach to remove stomach contents and to control vomiting (NG tube, or nasogastric tube). Not eating for 3-4 days. This gives the pancreas a rest, because enzymes are not being produced that can cause further damage. Antibiotic medicines, if your condition is caused by an infection. Treating any underlying conditions that may be the cause. Steroid medicines, if your condition is caused by your immune system attacking your body's own tissues (autoimmune disease). Surgery on the pancreas or gallbladder. Follow these instructions at home: Eating and drinking  Follow instructions from your health care provider about diet. This may involve avoiding alcohol and decreasing the amount of fat in your diet. Eat smaller, more frequent meals. This reduces the amount of digestive fluids that the pancreas produces. Drink enough fluid to keep your urine pale yellow. Do not drink alcohol if it caused your condition. General instructions Take over-the-counter and prescription medicines only as told by your health care provider. Do not drive or use heavy machinery while taking prescription pain medicine. Ask your health care provider if the medicine prescribed to you can cause constipation. You may need to take steps to prevent or treat constipation, such as: Take an over-the-counter or prescription medicine for constipation. Eat foods  that are high in fiber such as whole grains and beans. Limit foods that are high in fat and processed sugars, such as fried or sweet foods. Do not use any  products that contain nicotine or tobacco, such as cigarettes, e-cigarettes, and chewing tobacco. If you need help quitting, ask your health care provider. Get plenty of rest. If directed, check your blood sugar at home as told by your health care provider. Keep all follow-up visits as told by your health care provider. This is important. Contact a health care provider if you: Do not recover as quickly as expected. Develop new or worsening symptoms. Have persistent pain, weakness, or nausea. Recover and then have another episode of pain. Have a fever. Get help right away if: You cannot eat or keep fluids down. Your pain becomes severe. Your skin or the white part of your eyes turns yellow (jaundice). You have sudden swelling in your abdomen. You vomit. You feel dizzy or you faint. Your blood sugar is high (over 300 mg/dL). Summary Acute pancreatitis happens when inflammation of the pancreas suddenly occurs and the pancreas becomes irritated and swollen. This condition is typically caused by alcohol abuse, drug abuse, or gallstones. Treatment for this condition usually requires a stay in the hospital. This information is not intended to replace advice given to you by your health care provider. Make sure you discuss any questions you have with your health care provider. Document Revised: 01/14/2018 Document Reviewed: 10/01/2017 Elsevier Patient Education  2022 [place].   Pancreatitis Eating Plan Pancreatitis is when your pancreas becomes irritated and swollen (inflamed). The pancreas is a small organ located behind your stomach. It helps your body digest food and regulate your blood sugar. Pancreatitis can affect how your body digests food, especially foods with fat. You may also have other symptoms such as abdominal pain or nausea. When you have pancreatitis, following a low-fat eating plan may help you manage symptoms and recover more quickly. Work with your health care provider or  a diet and nutrition specialist (dietitian) to create an eating plan that is right for you. What are tips for following this plan? Reading food labels Use the information on food labels to help keep track of how much fat you eat: Check the serving size. Look for the amount of total fat in grams (g) in one serving. Low-fat foods have 3 g of fat or less per serving. Fat-free foods have 0.5 g of fat or less per serving. Keep track of how much fat you eat based on how many servings you eat. For example, if you eat two servings, the amount of fat you eat will be two times what is listed on the label. Shopping  Buy low-fat or nonfat foods, such as: Fresh, frozen, or canned fruits and vegetables. Grains, including pasta, bread, and rice. Lean meat, poultry, fish, and other protein foods. Low-fat or nonfat dairy. Avoid buying bakery products and other sweets made with whole milk, butter, and eggs. Avoid buying snack foods with added fat, such as anything with butter or cheese flavoring. Cooking Remove skin from poultry, and remove extra fat from meat. Limit the amount of fat and oil you use to 6 teaspoons or less per day. Cook using low-fat methods, such as boiling, broiling, grilling, steaming, or baking. Use spray oil to cook. Add fat-free chicken broth to add flavor and moisture. Avoid adding cream to thicken soups or sauces. Use other thickeners such as corn starch or tomato paste. Meal planning  Eat a low-fat diet as told by your dietitian. For most people, this means having no more than 55-65 grams of fat each day. Eat small, frequent meals throughout the day. For example, you may have 5-6 small meals instead of 3 large meals. Drink enough fluid to keep your urine pale yellow. Do not drink alcohol. Talk to your health care provider if you need help stopping. Limit how much caffeine you have, including black coffee, black and green tea, caffeinated soft drinks, and energy drinks. General  information Let your health care provider or dietitian know if you have unplanned weight loss on this eating plan. You may be instructed to follow a clear liquid diet during a flare of symptoms. Talk with your health care provider about how to manage your diet during symptoms of a flare. Take any vitamins or supplements as told by your health care provider. Work with a Microbiologist, especially if you have other conditions such as obesity or diabetes mellitus. What foods should I avoid? Fruits Fried fruits. Fruits served with butter or cream. Vegetables Fried vegetables. Vegetables cooked with butter, cheese, or cream. Grains Biscuits, waffles, donuts, pastries, and croissants. Pies and cookies. Butter-flavored popcorn. Regular crackers. Meats and other protein foods Fatty cuts of meat. Poultry with skin. Organ meats. Bacon, sausage, and cold cuts. Whole eggs. Nuts and nut butters. Dairy Whole and 2% milk. Whole milk yogurt. Whole milk ice cream. Cream and half-and-half. Cream cheese. Sour cream. Cheese. Beverages Wine, beer, and liquor. The items listed above may not be a complete list of foods and beverages to avoid. Contact a dietitian for more information. Summary Pancreatitis can affect how your body digests food, especially foods with fat. When you have pancreatitis, it is recommended that you follow a low-fat eating plan to help you recover more quickly and manage symptoms. For most people, this means limiting fat to no more than 55-65 grams per day. Do not drink alcohol. Limit the amount of caffeine you have, and drink enough fluid to keep your urine pale yellow. This information is not intended to replace advice given to you by your health care provider. Make sure you discuss any questions you have with your health care provider. Document Revised: 07/18/2018 Document Reviewed: 07/03/2017 Elsevier Patient Education  [place].

## 2021-04-12 NOTE — Addendum Note (Signed)
Addended by: Margarita Mail on: 04/12/2021 04:11 PM   Modules accepted: Orders

## 2021-04-12 NOTE — Progress Notes (Signed)
Acute Office Visit  Subjective:    Patient ID: Leslie [name], female    DOB: Mar 19, 1986, 36 y.o.   MRN: [id number]  Chief Complaint  Patient presents with   Abdominal Pain    Epigastric area onset for few months comes/go pain. Pt states also has been vomiting can not sit solid food in stomach cause then will vomit it out.     HPI Patient is in today for abdominal pain. She was seen for this in the ER recently.   Discharge Date: 04/09/21 Hospital/facility: [facility] Diagnosis: Acute pancreatitis  Procedures/tests: Lipase 171, no white count, UA showing few bacteria  Consultants: None New medications: Zofran, Norco Discontinued medications: None Discharge instructions:  Bowel rest Status: worse  Abdominal Pain: -Duration:2 months -Onset: sudden -Severity: severe -Quality: sharp -Location:  epigastric  Episode duration:  -Radiation: yes, to back -Frequency: intermittent -Alleviating factors: bowel rest -Aggravating factors: eating  -Status: worse, more frequent  -Treatments attempted: none -Fever: no -Nausea: yes, daily -Vomiting: yes, every morning  -Weight loss: yes -Decreased appetite: yes -Diarrhea: yes -Constipation: no -Blood in stool: no -Heartburn: no -Jaundice: no -Rash: no -Dysuria/urinary frequency: no -Hematuria: no -History of sexually transmitted disease: no -LMP: IUD -Recurrent NSAID use: no -Cholecystectomy in past  -Alcohol use: daily wine prior to symptoms   Past Medical History:  Diagnosis Date   Anxiety    Back injury    Bulging lumbar disc    Cholecystitis, chronic 26/94/8546   Complication of anesthesia    Current moderate episode of major depressive disorder without prior episode ([place]) 09/09/2018   Family history of adverse reaction to anesthesia    N/V-Mom   GERD (gastroesophageal reflux disease)    Headache    H/O   PONV (postoperative nausea and vomiting)     Past Surgical History:  Procedure Laterality Date   BREAST SURGERY      augmentation   CESAREAN SECTION  2005   CESAREAN SECTION  2016   CESAREAN SECTION  2005, 2016   CHOLECYSTECTOMY N/A 02/14/2016   Procedure: LAPAROSCOPIC CHOLECYSTECTOMY WITH INTRAOPERATIVE CHOLANGIOGRAM;  Surgeon: Robert Bellow, MD;  Location: [facility] ORS;  Service: General;  Laterality: N/A;   TONSILLECTOMY AND ADENOIDECTOMY  1987   TONSILLECTOMY AND ADENOIDECTOMY  1987    Family History  Problem Relation Age of Onset   Diabetes Mother    Thyroid disease Maternal Aunt    Ovarian cancer Maternal Grandmother    Heart attack Maternal Grandmother    Cervical cancer Maternal Grandmother    Heart attack Maternal Grandfather    Emphysema Paternal Grandmother     Social History   Socioeconomic History   Marital status: Married    Spouse name: Metro Kung   Number of children: 2   Years of education: 11   Highest education level: 11th grade  Occupational History   Not on file  Tobacco Use   Smoking status: Former    Packs/day: 0.15    Years: 15.00    Pack years: 2.25    Types: Cigarettes    Quit date: 05/11/2020    Years since quitting: 0.9   Smokeless tobacco: Never  Vaping Use   Vaping Use: Never used  Substance and Sexual Activity   Alcohol use: Yes    Alcohol/week: 7.0 standard drinks    Types: 7 Glasses of wine per week    Comment: 1 glass of wine   Drug use: No    Comment: pt denies during phone interview but UDS  was + for marijuana in 2016   Sexual activity: Yes    Birth control/protection: I.U.D.  Other Topics Concern   Not on file  Social History Narrative   ** Merged History Encounter **       Social Determinants of Health   Financial Resource Strain: Not on file  Food Insecurity: No Food Insecurity   Worried About Charity fundraiser in the Last Year: Never true   Arboriculturist in the Last Year: Never true  Transportation Needs: No Transportation Needs   Lack of Transportation (Medical): No   Lack of Transportation (Non-Medical): No  Physical  Activity: Sufficiently Active   Days of Exercise per Week: 7 days   Minutes of Exercise per Session: 60 min  Stress: No Stress Concern Present   Feeling of Stress : Not at all  Social Connections: Moderately Isolated   Frequency of Communication with Friends and Family: More than three times a week   Frequency of Social Gatherings with Friends and Family: More than three times a week   Attends Religious Services: Never   Marine scientist or Organizations: No   Attends Music therapist: Never   Marital Status: Married  Human resources officer Violence: Not At Risk   Fear of Current or Ex-Partner: No   Emotionally Abused: No   Physically Abused: No   Sexually Abused: No    Outpatient Medications Prior to Visit  Medication Sig Dispense Refill   albuterol (VENTOLIN HFA) 108 (90 Base) MCG/ACT inhaler Inhale 2 puffs into the lungs every 4 (four) hours as needed for wheezing or shortness of breath. 18 g 0   Cetirizine HCl (ZYRTEC ALLERGY) 10 MG TBDP Take 10 mg by mouth at bedtime. 30 tablet 0   fluticasone (FLONASE) 50 MCG/ACT nasal spray SPRAY 2 SPRAYS INTO EACH NOSTRIL EVERY DAY 16 mL 2   HYDROcodone-acetaminophen (NORCO/VICODIN) 5-325 MG tablet Take 1-2 tablets by mouth every 6 (six) hours as needed. 25 tablet 0   ibuprofen (ADVIL) 600 MG tablet Take 1 tablet (600 mg total) by mouth every 6 (six) hours as needed. 30 tablet 0   levonorgestrel (MIRENA) 20 MCG/24HR IUD 1 each by Intrauterine route once.     ondansetron (ZOFRAN-ODT) 8 MG disintegrating tablet Take 1 tablet (8 mg total) by mouth every 8 (eight) hours as needed for nausea or vomiting. 20 tablet 0   No facility-administered medications prior to visit.    Allergies  Allergen Reactions   Amoxicillin Hives    Has patient had a PCN reaction causing immediate rash, facial/tongue/throat swelling, SOB or lightheadedness with hypotension: {no Has patient had a PCN reaction causing severe rash involving mucus membranes or  skin necrosis: no Has patient had a PCN reaction that required hospitalization no Has patient had a PCN reaction occurring within the last 10 years: no If all of the above answers are "NO", then may proceed with Cephalosporin use.   Bactrim [Sulfamethoxazole-Trimethoprim] Hives   Penicillins Hives    Has patient had a PCN reaction causing immediate rash, facial/tongue/throat swelling, SOB or lightheadedness with hypotension: {no Has patient had a PCN reaction causing severe rash involving mucus membranes or skin necrosis: {no Has patient had a PCN reaction that required hospitalization no Has patient had a PCN reaction occurring within the last 10 years: {no If all of the above answers are "NO", then may proceed with Cephalosporin use.   Tape Rash    PT TOLERATES PAPER TAPE WELL  Review of Systems  Constitutional:  Positive for appetite change and unexpected weight change. Negative for chills and fever.  Respiratory:  Negative for cough.   Cardiovascular:  Negative for chest pain.  Gastrointestinal:  Positive for abdominal pain, diarrhea, nausea and vomiting. Negative for blood in stool and constipation.  Genitourinary:  Negative for dysuria and hematuria.      Objective:    Physical Exam Constitutional:      Appearance: She is well-developed.  HENT:     Head: Normocephalic and atraumatic.  Cardiovascular:     Rate and Rhythm: Normal rate and regular rhythm.  Pulmonary:     Effort: Pulmonary effort is normal.     Breath sounds: Normal breath sounds.  Abdominal:     General: Bowel sounds are decreased. There is no distension.     Palpations: Abdomen is soft. There is no hepatomegaly.     Tenderness: There is abdominal tenderness in the epigastric area. There is guarding. There is no rebound.  Skin:    General: Skin is warm and dry.  Neurological:     General: No focal deficit present.     Mental Status: She is alert. She is disoriented.  Psychiatric:        Mood and  Affect: Mood normal.        Behavior: Behavior normal.    BP 112/78    Pulse 92    Temp 98.9 F (37.2 C) (Oral)    Resp 16    Ht '5\' 5"'  (1.651 m)    Wt 125 lb 12.8 oz (57.1 kg)    SpO2 98%    BMI 20.93 kg/m  Wt Readings from Last 3 Encounters:  04/12/21 125 lb 12.8 oz (57.1 kg)  04/09/21 130 lb (59 kg)  12/23/20 125 lb 11.2 oz (57 kg)    Health Maintenance Due  Topic Date Due   COVID-19 Vaccine (1) Never done   Pneumococcal Vaccine 23-46 Years old (1 - PCV) Never done    There are no preventive care reminders to display for this patient.   Lab Results  Component Value Date   TSH 1.23 12/23/2020   Lab Results  Component Value Date   WBC 9.6 04/09/2021   HGB 14.1 04/09/2021   HCT 44.1 04/09/2021   MCV 91.9 04/09/2021   PLT 304 04/09/2021   Lab Results  Component Value Date   NA 140 04/09/2021   K 4.7 04/09/2021   CO2 29 04/09/2021   GLUCOSE 110 (H) 04/09/2021   BUN 16 04/09/2021   CREATININE 0.77 04/09/2021   BILITOT 0.6 04/09/2021   ALKPHOS 46 04/09/2021   AST 29 04/09/2021   ALT 33 04/09/2021   PROT 7.5 04/09/2021   ALBUMIN 4.6 04/09/2021   CALCIUM 9.7 04/09/2021   ANIONGAP 6 04/09/2021   EGFR 109 12/23/2020   Lab Results  Component Value Date   CHOL 182 12/23/2020   Lab Results  Component Value Date   HDL 74 12/23/2020   Lab Results  Component Value Date   LDLCALC 91 12/23/2020   Lab Results  Component Value Date   TRIG 76 12/23/2020   Lab Results  Component Value Date   CHOLHDL 2.5 12/23/2020   No results found for: HGBA1C     Assessment & Plan:   1. Epigastric pain/Acute pancreatitis, unspecified complication status, unspecified pancreatitis type: Diagnosed with acute pancreatitis at [facility] on 12/32 but pain increasing in severity and frequency despite bowel rest, pain medications. Repeat lipase and CMP  to trend. CT A/P ordered as well to assess for any complications from pancreatitis, abscess, necrosis, etc. Prior to symptoms she does  endorse having daily wine use, perhaps alcohol induced vs. Gallstone pancreatitis. Discussed bowel rest, abstaining from alcohol, fatty foods. Zofran as needed. Follow up in 1 week for recheck.  - Lipase - COMPLETE METABOLIC PANEL WITH GFR - CT Abdomen Pelvis Wo Contrast; Future   Teodora Medici, DO

## 2021-04-19 ENCOUNTER — Ambulatory Visit: Payer: BC Managed Care – PPO | Admitting: Internal Medicine

## 2021-05-03 ENCOUNTER — Encounter: Payer: Self-pay | Admitting: Unknown Physician Specialty

## 2021-05-03 ENCOUNTER — Ambulatory Visit: Payer: BC Managed Care – PPO | Admitting: Unknown Physician Specialty

## 2021-05-03 DIAGNOSIS — F410 Panic disorder [episodic paroxysmal anxiety] without agoraphobia: Secondary | ICD-10-CM

## 2021-05-03 LAB — CBC WITH DIFFERENTIAL/PLATELET
Absolute Monocytes: 412 cells/uL (ref 200–950)
Basophils Absolute: 43 cells/uL (ref 0–200)
Basophils Relative: 0.6 %
Eosinophils Absolute: 114 cells/uL (ref 15–500)
Eosinophils Relative: 1.6 %
HCT: 44.2 % (ref 35.0–45.0)
Hemoglobin: 14.4 g/dL (ref 11.7–15.5)
Lymphs Abs: 1548 cells/uL (ref 850–3900)
MCH: 29.3 pg (ref 27.0–33.0)
MCHC: 32.6 g/dL (ref 32.0–36.0)
MCV: 90 fL (ref 80.0–100.0)
MPV: 10.2 fL (ref 7.5–12.5)
Monocytes Relative: 5.8 %
Neutro Abs: 4984 cells/uL (ref 1500–7800)
Neutrophils Relative %: 70.2 %
Platelets: 296 10*3/uL (ref 140–400)
RBC: 4.91 10*6/uL (ref 3.80–5.10)
RDW: 12.3 % (ref 11.0–15.0)
Total Lymphocyte: 21.8 %
WBC: 7.1 10*3/uL (ref 3.8–10.8)

## 2021-05-03 LAB — LIPID PANEL
Cholesterol: 176 mg/dL (ref <200)
HDL: 58 mg/dL (ref >=50)
LDL Cholesterol (Calc): 97 mg/dL (calc)
Non-HDL Cholesterol (Calc): 118 mg/dL (calc) (ref <130)
Total CHOL/HDL Ratio: 3 (calc) (ref <5.0)
Triglycerides: 114 mg/dL (ref <150)

## 2021-05-03 LAB — COMPLETE METABOLIC PANEL WITH GFR
AG Ratio: 2.2 (calc) (ref 1.0–2.5)
ALT: 13 U/L (ref 6–29)
AST: 17 U/L (ref 10–30)
Albumin: 4.9 g/dL (ref 3.6–5.1)
Alkaline phosphatase (APISO): 46 U/L (ref 31–125)
BUN: 9 mg/dL (ref 7–25)
CO2: 33 mmol/L — ABNORMAL HIGH (ref 20–32)
Calcium: 10.1 mg/dL (ref 8.6–10.2)
Chloride: 103 mmol/L (ref 98–110)
Creat: 0.75 mg/dL (ref 0.50–0.97)
Globulin: 2.2 g/dL (calc) (ref 1.9–3.7)
Glucose, Bld: 90 mg/dL (ref 65–99)
Potassium: 4.2 mmol/L (ref 3.5–5.3)
Sodium: 138 mmol/L (ref 135–146)
Total Bilirubin: 0.6 mg/dL (ref 0.2–1.2)
Total Protein: 7.1 g/dL (ref 6.1–8.1)
eGFR: 105 mL/min/{1.73_m2} (ref >=60)

## 2021-05-03 LAB — TSH: TSH: 2.44 mIU/L

## 2021-05-03 MED ORDER — CITALOPRAM HYDROBROMIDE 20 MG PO TABS: 20.0000 mg | ORAL_TABLET | Freq: Every day | ORAL | 3 refills | Status: DC

## 2021-05-03 MED ORDER — PROPRANOLOL HCL 10 MG PO TABS: 10.0000 mg | ORAL_TABLET | Freq: Three times a day (TID) | ORAL | 0 refills | Status: DC | PRN

## 2021-05-03 NOTE — Assessment & Plan Note (Signed)
Pt with a great deal of anxiety related to grief.  Recommend counseling/continue with talking to pastor.  Recommend app such as Betterhelp or [employer].  Apps such as Calm.  Rx for Cilatopram 20 mg and Inderal 10 mg TID prn

## 2021-05-03 NOTE — Patient Instructions (Signed)
On-line therapy:  Gretta Arab

## 2021-05-03 NOTE — Progress Notes (Signed)
BP 136/82    Pulse (!) 108    Temp 98.5 F (36.9 C) (Oral)    Resp 16    Ht '5\' 5"'  (1.651 m)    Wt 125 lb 9.6 oz (57 kg)    SpO2 96%    BMI 20.90 kg/m    Subjective:    Patient ID: Leslie [name], female    DOB: 24-Dec-1985, 36 y.o.   MRN: [id number]  HPI: Leslie [name] is a 36 y.o. female  Chief Complaint  Patient presents with   Panic Attack    Pt states is new, started tearing.   Pt with shaking, SOB, palpitations, particularly in the middle of the night.  States she feels her "stomach just drops" and then knows this feeling of panic will start.  But also having generally anxious feelings most of the day which come and go.  Ongoing for about 3 weeks since the death of her father who died suddenly of a blood clot.  Talked to her pastor.  Quit smoking 1 year ago.  1 cup of coffee/day.  Has taken albuterol  and not seen that it helps or hurts. No history of depression or mania.  Lives with husband and 3 children  Depression screen [facility] 2/9 05/03/2021 04/12/2021 12/23/2020 02/03/2020 01/02/2020  Decreased Interest 1 0 0 0 0  Down, Depressed, Hopeless 1 0 0 0 0  PHQ - 2 Score 2 0 0 0 0  Altered sleeping 1 0 0 - -  Tired, decreased energy 1 0 0 - -  Change in appetite 1 0 0 - -  Feeling bad or failure about yourself  0 0 0 - -  Trouble concentrating 1 0 0 - -  Moving slowly or fidgety/restless 0 0 0 - -  Suicidal thoughts - 0 0 - -  PHQ-9 Score 6 0 0 - -  Difficult doing work/chores Somewhat difficult Not difficult at all Not difficult at all - -   GAD 7 : Generalized Anxiety Score 05/03/2021  Nervous, Anxious, on Edge 3  Control/stop worrying 3  Worry too much - different things 1  Trouble relaxing 3  Restless 2  Easily annoyed or irritable 1  Afraid - awful might happen 0  Total GAD 7 Score 13  Anxiety Difficulty Somewhat difficult    Relevant past medical, surgical, family and social history reviewed and updated as indicated. Interim medical history since our last visit  reviewed. Allergies and medications reviewed and updated.  Review of Systems  Constitutional:  Positive for diaphoresis.  Respiratory:  Negative for wheezing.   Cardiovascular:  Positive for palpitations.   Per HPI unless specifically indicated above     Objective:    BP 136/82    Pulse (!) 108    Temp 98.5 F (36.9 C) (Oral)    Resp 16    Ht '5\' 5"'  (1.651 m)    Wt 125 lb 9.6 oz (57 kg)    SpO2 96%    BMI 20.90 kg/m   Wt Readings from Last 3 Encounters:  05/03/21 125 lb 9.6 oz (57 kg)  04/12/21 125 lb 12.8 oz (57.1 kg)  04/09/21 130 lb (59 kg)    Physical Exam Constitutional:      General: She is not in acute distress.    Appearance: Normal appearance. She is well-developed.  HENT:     Head: Normocephalic and atraumatic.  Eyes:     General: Lids are normal. No scleral icterus.  Right eye: No discharge.        Left eye: No discharge.     Conjunctiva/sclera: Conjunctivae normal.  Neck:     Vascular: No carotid bruit or JVD.  Cardiovascular:     Rate and Rhythm: Normal rate and regular rhythm.     Heart sounds: Normal heart sounds.  Pulmonary:     Effort: Pulmonary effort is normal. No respiratory distress.     Breath sounds: Normal breath sounds.  Abdominal:     Palpations: There is no hepatomegaly or splenomegaly.  Musculoskeletal:        General: Normal range of motion.     Cervical back: Normal range of motion and neck supple.  Skin:    General: Skin is warm and dry.     Coloration: Skin is not pale.     Findings: No rash.  Neurological:     Mental Status: She is alert and oriented to person, place, and time.  Psychiatric:        Behavior: Behavior normal.        Thought Content: Thought content normal.        Judgment: Judgment normal.    Results for orders placed or performed in visit on 04/12/21  Lipase  Result Value Ref Range   Lipase 51 7 - 60 U/L  COMPLETE METABOLIC PANEL WITH GFR  Result Value Ref Range   Glucose, Bld 73 65 - 99 mg/dL   BUN  13 7 - 25 mg/dL   Creat 0.85 0.50 - 0.97 mg/dL   eGFR 90 > OR = 60 mL/min/1.67m   BUN/Creatinine Ratio NOT APPLICABLE 6 - 22 (calc)   Sodium 138 135 - 146 mmol/L   Potassium 4.8 3.5 - 5.3 mmol/L   Chloride 101 98 - 110 mmol/L   CO2 30 20 - 32 mmol/L   Calcium 10.1 8.6 - 10.2 mg/dL   Total Protein 7.0 6.1 - 8.1 g/dL   Albumin 4.7 3.6 - 5.1 g/dL   Globulin 2.3 1.9 - 3.7 g/dL (calc)   AG Ratio 2.0 1.0 - 2.5 (calc)   Total Bilirubin 0.5 0.2 - 1.2 mg/dL   Alkaline phosphatase (APISO) 43 31 - 125 U/L   AST 20 10 - 30 U/L   ALT 20 6 - 29 U/L      Assessment & Plan:   Problem List Items Addressed This Visit       Unprioritized   Panic anxiety syndrome    Pt with a great deal of anxiety related to grief.  Recommend counseling/continue with talking to pastor.  Recommend app such as Betterhelp or Talkspace.  Apps such as Calm.  Rx for Cilatopram 20 mg and Inderal 10 mg TID prn      Relevant Medications   citalopram (CELEXA) 20 MG tablet   Other Relevant Orders   CBC with Differential/Platelet   COMPLETE METABOLIC PANEL WITH GFR   TSH   Lipid panel     Follow up plan: Return in about 1 week (around 05/10/2021).

## 2021-05-10 ENCOUNTER — Ambulatory Visit: Payer: BC Managed Care – PPO | Admitting: Unknown Physician Specialty

## 2021-05-17 ENCOUNTER — Ambulatory Visit: Payer: BC Managed Care – PPO | Admitting: Unknown Physician Specialty

## 2021-05-17 ENCOUNTER — Encounter: Payer: Self-pay | Admitting: Unknown Physician Specialty

## 2021-05-17 DIAGNOSIS — F322 Major depressive disorder, single episode, severe without psychotic features: Secondary | ICD-10-CM

## 2021-05-17 NOTE — Progress Notes (Signed)
° °BP 114/66    Pulse 95    Temp 98.9 °F (37.2 °C) (Oral)    Resp 16    Ht 5' 5" (1.651 m)    Wt 121 lb 6.4 oz (55.1 kg)    SpO2 99%    BMI 20.20 kg/m²   ° °Subjective:  ° ° Patient ID: Leslie [name], female    DOB: 04/14/1985, 36 y.o.   MRN: [id number] ° °HPI: °Leslie [name] is a 36 y.o. female ° °Chief Complaint  °Patient presents with  ° Follow-up  ° Anxiety  ° ° ° °Pt is here for a 2 week f/u with taking Citalopram.  Pt is no longer having panic attacks, took a dose of Inderal recently.  However, since her panic is gone, she is having more trouble with depression.  States she cries easily and feels down, depressed, and hopeless.   ° °Depression screen PHQ 2/9 05/17/2021 05/03/2021 04/12/2021 12/23/2020 02/03/2020  °Decreased Interest 2 1 0 0 0  °Down, Depressed, Hopeless 3 1 0 0 0  °PHQ - 2 Score 5 2 0 0 0  °Altered sleeping 3 1 0 0 -  °Tired, decreased energy 3 1 0 0 -  °Change in appetite 3 1 0 0 -  °Feeling bad or failure about yourself  0 0 0 0 -  °Trouble concentrating 1 1 0 0 -  °Moving slowly or fidgety/restless 0 0 0 0 -  °Suicidal thoughts 0 - 0 0 -  °PHQ-9 Score 15 6 0 0 -  °Difficult doing work/chores Somewhat difficult Somewhat difficult Not difficult at all Not difficult at all -  °Some recent data might be hidden  ° °GAD 7 : Generalized Anxiety Score 05/17/2021 05/03/2021  °Nervous, Anxious, on Edge 2 3  °Control/stop worrying 0 3  °Worry too much - different things 0 1  °Trouble relaxing 3 3  °Restless 0 2  °Easily annoyed or irritable 2 1  °Afraid - awful might happen 0 0  °Total GAD 7 Score 7 13  °Anxiety Difficulty Somewhat difficult Somewhat difficult  ° ° °Relevant past medical, surgical, family and social history reviewed and updated as indicated. Interim medical history since our last visit reviewed. °Allergies and medications reviewed and updated. ° °Review of Systems  °Constitutional:  Positive for fatigue.  °HENT: Negative.    °Cardiovascular: Negative.   ° °Per HPI unless specifically indicated  above ° °   °Objective:  °  °BP 114/66    Pulse 95    Temp 98.9 °F (37.2 °C) (Oral)    Resp 16    Ht 5' 5" (1.651 m)    Wt 121 lb 6.4 oz (55.1 kg)    SpO2 99%    BMI 20.20 kg/m²   °Wt Readings from Last 3 Encounters:  °05/17/21 121 lb 6.4 oz (55.1 kg)  °05/03/21 125 lb 9.6 oz (57 kg)  °04/12/21 125 lb 12.8 oz (57.1 kg)  °  °Physical Exam °Constitutional:   °   General: She is not in acute distress. °   Appearance: Normal appearance. She is well-developed.  °HENT:  °   Head: Normocephalic and atraumatic.  °Eyes:  °   General: Lids are normal. No scleral icterus.    °   Right eye: No discharge.     °   Left eye: No discharge.  °   Conjunctiva/sclera: Conjunctivae normal.  °Neck:  °   Vascular: No carotid bruit or JVD.  °Cardiovascular:  °     Rate and Rhythm: Normal rate and regular rhythm.  °   Heart sounds: Normal heart sounds.  °Pulmonary:  °   Effort: Pulmonary effort is normal. No respiratory distress.  °   Breath sounds: Normal breath sounds.  °Abdominal:  °   Palpations: There is no hepatomegaly or splenomegaly.  °Musculoskeletal:     °   General: Normal range of motion.  °   Cervical back: Normal range of motion and neck supple.  °Skin: °   General: Skin is warm and dry.  °   Coloration: Skin is not pale.  °   Findings: No rash.  °Neurological:  °   Mental Status: She is alert and oriented to person, place, and time.  °Psychiatric:     °   Behavior: Behavior normal.     °   Thought Content: Thought content normal.     °   Judgment: Judgment normal.  ° ° °Results for orders placed or performed in visit on 05/03/21  °CBC with Differential/Platelet  °Result Value Ref Range  ° WBC 7.1 3.8 - 10.8 Thousand/uL  ° RBC 4.91 3.80 - 5.10 Million/uL  ° Hemoglobin 14.4 11.7 - 15.5 g/dL  ° HCT 44.2 35.0 - 45.0 %  ° MCV 90.0 80.0 - 100.0 fL  ° MCH 29.3 27.0 - 33.0 pg  ° MCHC 32.6 32.0 - 36.0 g/dL  ° RDW 12.3 11.0 - 15.0 %  ° Platelets 296 140 - 400 Thousand/uL  ° MPV 10.2 7.5 - 12.5 fL  ° Neutro Abs 4,984 1,500 - 7,800  cells/uL  ° Lymphs Abs 1,548 850 - 3,900 cells/uL  ° Absolute Monocytes 412 200 - 950 cells/uL  ° Eosinophils Absolute 114 15 - 500 cells/uL  ° Basophils Absolute 43 0 - 200 cells/uL  ° Neutrophils Relative % 70.2 %  ° Total Lymphocyte 21.8 %  ° Monocytes Relative 5.8 %  ° Eosinophils Relative 1.6 %  ° Basophils Relative 0.6 %  °COMPLETE METABOLIC PANEL WITH GFR  °Result Value Ref Range  ° Glucose, Bld 90 65 - 99 mg/dL  ° BUN 9 7 - 25 mg/dL  ° Creat 0.75 0.50 - 0.97 mg/dL  ° eGFR 105 > OR = 60 mL/min/1.73m2  ° BUN/Creatinine Ratio NOT APPLICABLE 6 - 22 (calc)  ° Sodium 138 135 - 146 mmol/L  ° Potassium 4.2 3.5 - 5.3 mmol/L  ° Chloride 103 98 - 110 mmol/L  ° CO2 33 (H) 20 - 32 mmol/L  ° Calcium 10.1 8.6 - 10.2 mg/dL  ° Total Protein 7.1 6.1 - 8.1 g/dL  ° Albumin 4.9 3.6 - 5.1 g/dL  ° Globulin 2.2 1.9 - 3.7 g/dL (calc)  ° AG Ratio 2.2 1.0 - 2.5 (calc)  ° Total Bilirubin 0.6 0.2 - 1.2 mg/dL  ° Alkaline phosphatase (APISO) 46 31 - 125 U/L  ° AST 17 10 - 30 U/L  ° ALT 13 6 - 29 U/L  °TSH  °Result Value Ref Range  ° TSH 2.44 mIU/L  °Lipid panel  °Result Value Ref Range  ° Cholesterol 176 <200 mg/dL  ° HDL 58 > OR = 50 mg/dL  ° Triglycerides 114 <150 mg/dL  ° LDL Cholesterol (Calc) 97 mg/dL (calc)  ° Total CHOL/HDL Ratio 3.0 <5.0 (calc)  ° Non-HDL Cholesterol (Calc) 118 <130 mg/dL (calc)  ° °   °Assessment & Plan:  ° °Problem List Items Addressed This Visit   ° °  ° Unprioritized  ° Severe depression (HCC)  °    Taking Citalopram 20 mg which helped with her panic disorder, but depression is worse.  Recommended counseling through an app and will continue with pastor.  Discussed medicaiton may take 4-6 weeks for full effect.  No suicidal ideation.   F/u in 2 weeks °  °  °  ° °Follow up plan: °Return in about 2 weeks (around 05/31/2021). ° ° ° ° ° °

## 2021-05-17 NOTE — Patient Instructions (Signed)
Apps for depression:  Betterhelp Talkspace

## 2021-05-17 NOTE — Assessment & Plan Note (Signed)
Taking Citalopram 20 mg which helped with her panic disorder, but depression is worse.  Recommended counseling through an app and will continue with pastor.  Discussed medicaiton may take 4-6 weeks for full effect.  No suicidal ideation.   F/u in 2 weeks

## 2021-05-27 ENCOUNTER — Other Ambulatory Visit: Payer: Self-pay | Admitting: Unknown Physician Specialty

## 2021-05-27 ENCOUNTER — Ambulatory Visit
Admission: EM | Admit: 2021-05-27 | Discharge: 2021-05-27 | Disposition: A | Discharge status: Home / Self Care | Payer: BC Managed Care – PPO | Attending: Emergency Medicine | Admitting: Emergency Medicine

## 2021-05-27 ENCOUNTER — Ambulatory Visit (INDEPENDENT_AMBULATORY_CARE_PROVIDER_SITE_OTHER): Payer: BC Managed Care – PPO

## 2021-05-27 ENCOUNTER — Telehealth: Payer: Self-pay | Admitting: Emergency Medicine

## 2021-05-27 ENCOUNTER — Other Ambulatory Visit: Payer: Self-pay

## 2021-05-27 DIAGNOSIS — R059 Cough, unspecified: Secondary | ICD-10-CM | POA: Diagnosis not present

## 2021-05-27 DIAGNOSIS — R0602 Shortness of breath: Secondary | ICD-10-CM | POA: Diagnosis not present

## 2021-05-27 DIAGNOSIS — J40 Bronchitis, not specified as acute or chronic: Secondary | ICD-10-CM | POA: Diagnosis not present

## 2021-05-27 MED ORDER — BENZONATATE 100 MG PO CAPS: 100.0000 mg | ORAL_CAPSULE | Freq: Three times a day (TID) | ORAL | 0 refills | Status: DC

## 2021-05-27 MED ORDER — AZITHROMYCIN 250 MG PO TABS: 250.0000 mg | ORAL_TABLET | Freq: Every day | ORAL | 0 refills | Status: DC

## 2021-05-27 MED ORDER — PROMETHAZINE-DM 6.25-15 MG/5ML PO SYRP: 5.0000 mL | ORAL_SOLUTION | Freq: Four times a day (QID) | ORAL | 0 refills | Status: DC | PRN

## 2021-05-27 MED ORDER — PREDNISONE 20 MG PO TABS: 40.0000 mg | ORAL_TABLET | Freq: Every day | ORAL | 0 refills | Status: DC

## 2021-05-27 MED ORDER — ALBUTEROL SULFATE HFA 108 (90 BASE) MCG/ACT IN AERS: 2.0000 | INHALATION_SPRAY | RESPIRATORY_TRACT | 0 refills | Status: DC | PRN

## 2021-05-27 NOTE — Telephone Encounter (Signed)
Entered in Error

## 2021-05-27 NOTE — ED Provider Notes (Signed)
[facility]    CSN: [id number] Arrival date & time: 05/27/21  1438      History   Chief Complaint Chief Complaint  Patient presents with   Shortness of Breath    HPI Leslie [name] is a 36 y.o. female.   Patient presents with a nonproductive cough for 2 weeks endorses that 1 week ago coughing worsened with accompanying chest tightness, intermittent shortness of breath with exertion and wheezing .  Cough initially began with upper respiratory infection.  Symptoms interfering with sleep.  Has attempted use of Delsym and Mucinex which have not been helpful.  Tolerating food and liquids.  No known sick contacts.  History of GERD, anxiety.  Former tobacco smoker.     Past Medical History:  Diagnosis Date   Anxiety    Back injury    Bulging lumbar disc    Cholecystitis, chronic 02/23/2016   Complication of anesthesia    Current moderate episode of major depressive disorder without prior episode (HCC) 09/09/2018   Family history of adverse reaction to anesthesia    N/V-Mom   GERD (gastroesophageal reflux disease)    Headache    H/O   PONV (postoperative nausea and vomiting)     Patient Active Problem List   Diagnosis Date Noted   Severe depression (HCC) 05/17/2021   Panic anxiety syndrome 05/03/2021   Hyperlipidemia 12/23/2019   Family history of thyroid disease 12/23/2019   Family history of diabetes mellitus in mother 12/23/2019   Gastroesophageal reflux disease without esophagitis 09/09/2018   Lumbosacral radiculopathy 01/07/2015    Past Surgical History:  Procedure Laterality Date   BREAST SURGERY     augmentation   CESAREAN SECTION  2005   CESAREAN SECTION  2016   CESAREAN SECTION  2005, 2016   CHOLECYSTECTOMY N/A 02/14/2016   Procedure: LAPAROSCOPIC CHOLECYSTECTOMY WITH INTRAOPERATIVE CHOLANGIOGRAM;  Surgeon: Earline Mayotte, MD;  Location: [facility] ORS;  Service: General;  Laterality: N/A;   TONSILLECTOMY AND ADENOIDECTOMY  1987   TONSILLECTOMY AND  ADENOIDECTOMY  1987    OB History     Gravida  5   Para  2   Term  0   Preterm  0   AB  2   Living         SAB  1   IAB  0   Ectopic  0   Multiple      Live Births           Obstetric Comments  1st Menstrual Cycle:  10  1st Pregnancy:  17  1st Menstrual Cycle:  10 1st Pregnancy:  17           Home Medications    Prior to Admission medications   Medication Sig Start Date End Date Taking? Authorizing Provider  albuterol (VENTOLIN HFA) 108 (90 Base) MCG/ACT inhaler Inhale 2 puffs into the lungs every 4 (four) hours as needed for wheezing or shortness of breath. 12/23/20  Yes Danelle Berry, PA-C  fluticasone (FLONASE) 50 MCG/ACT nasal spray SPRAY 2 SPRAYS INTO EACH NOSTRIL EVERY DAY 03/31/21  Yes Danelle Berry, PA-C  HYDROcodone-acetaminophen (NORCO/VICODIN) 5-325 MG tablet Take 1-2 tablets by mouth every 6 (six) hours as needed. 04/09/21  Yes Becky Augusta, NP  levonorgestrel (MIRENA) 20 MCG/24HR IUD 1 each by Intrauterine route once.   Yes [provider]  propranolol (INDERAL) 10 MG tablet Take 1 tablet (10 mg total) by mouth 3 (three) times daily as needed. 05/03/21  Yes Gabriel Cirri, NP  Cetirizine HCl (ZYRTEC ALLERGY) 10 MG TBDP Take 10 mg by mouth at bedtime. Patient not taking: Reported on 05/17/2021 01/02/20   Danelle Berry, PA-C  citalopram (CELEXA) 20 MG tablet Take 1 tablet (20 mg total) by mouth daily. 05/03/21   Gabriel Cirri, NP  ibuprofen (ADVIL) 600 MG tablet Take 1 tablet (600 mg total) by mouth every 6 (six) hours as needed. 11/13/19   Domenick Gong, MD  ondansetron (ZOFRAN-ODT) 8 MG disintegrating tablet Take 1 tablet (8 mg total) by mouth every 8 (eight) hours as needed for nausea or vomiting. Patient not taking: Reported on 05/17/2021 04/09/21   Becky Augusta, NP    Family History Family History  Problem Relation Age of Onset   Diabetes Mother    Thyroid disease Maternal Aunt    Ovarian cancer Maternal Grandmother    Heart attack  Maternal Grandmother    Cervical cancer Maternal Grandmother    Heart attack Maternal Grandfather    Emphysema Paternal Grandmother     Social History Social History   Tobacco Use   Smoking status: Former    Packs/day: 0.15    Years: 15.00    Pack years: 2.25    Types: Cigarettes    Quit date: 05/11/2020    Years since quitting: 1.0   Smokeless tobacco: Never  Vaping Use   Vaping Use: Never used  Substance Use Topics   Alcohol use: Yes    Alcohol/week: 7.0 standard drinks    Types: 7 Glasses of wine per week    Comment: 1 glass of wine   Drug use: No    Comment: pt denies during phone interview but UDS was + for marijuana in 2016     Allergies   Amoxicillin, Bactrim [sulfamethoxazole-trimethoprim], Penicillins, and Tape   Review of Systems Review of Systems  Constitutional: Negative.   HENT: Negative.    Respiratory:  Positive for cough, shortness of breath and wheezing. Negative for apnea, choking, chest tightness and stridor.   Cardiovascular: Negative.   Gastrointestinal: Negative.   Skin: Negative.   Neurological: Negative.     Physical Exam Triage Vital Signs ED Triage Vitals  Enc Vitals Group     BP 05/27/21 1556 (!) 129/91     Pulse Rate 05/27/21 1556 60     Resp 05/27/21 1556 20     Temp 05/27/21 1556 98.4 F (36.9 C)     Temp Source 05/27/21 1556 Oral     SpO2 05/27/21 1556 100 %     Weight --      Height --      Head Circumference --      Peak Flow --      Pain Score 05/27/21 1551 4     Pain Loc --      Pain Edu? --      Excl. in GC? --    No data found.  Updated Vital Signs BP (!) 129/91    Pulse 60    Temp 98.4 F (36.9 C) (Oral)    Resp 20    SpO2 100%   Visual Acuity Right Eye Distance:   Left Eye Distance:   Bilateral Distance:    Right Eye Near:   Left Eye Near:    Bilateral Near:     Physical Exam Constitutional:      Appearance: She is well-developed.  HENT:     Head: Normocephalic.     Right Ear: Tympanic membrane,  ear canal and external ear normal.  Left Ear: Tympanic membrane, ear canal and external ear normal.     Nose: Congestion present. No rhinorrhea.     Mouth/Throat:     Mouth: Mucous membranes are moist.     Pharynx: Oropharynx is clear.  Eyes:     Extraocular Movements: Extraocular movements intact.  Cardiovascular:     Rate and Rhythm: Normal rate and regular rhythm.     Pulses: Normal pulses.     Heart sounds: Normal heart sounds.  Pulmonary:     Effort: Pulmonary effort is normal.     Breath sounds: Examination of the right-upper field reveals wheezing. Examination of the left-upper field reveals wheezing. Examination of the right-middle field reveals wheezing. Examination of the left-middle field reveals wheezing. Wheezing present.  Musculoskeletal:     Cervical back: Normal range of motion and neck supple.  Skin:    General: Skin is warm and dry.  Neurological:     Mental Status: She is alert and oriented to person, place, and time. Mental status is at baseline.  Psychiatric:        Mood and Affect: Mood normal.        Behavior: Behavior normal.     UC Treatments / Results  Labs (all labs ordered are listed, but only abnormal results are displayed) Labs Reviewed - No data to display  EKG   Radiology No results found.  Procedures Procedures (including critical care time)  Medications Ordered in UC Medications - No data to display  Initial Impression / Assessment and Plan / UC Course  I have reviewed the triage vital signs and the nursing notes.  Pertinent labs & imaging results that were available during my care of the patient were reviewed by me and considered in my medical decision making (see chart for details).  Bronchitis  Vital signs stable, O2 saturation 100% on room air, wheezing heard to auscultation, chest x-ray negative, discussed findings with patient, will move forward with coverage for bronchitis based on exam, Z-Pak, prednisone and albuterol  inhaler prescribed for shortness of breath and wheezing, Tessalon and Promethazine DM prescribed for management of cough, patient may continue use of over-the-counter medicine and seek care for additional support, may follow-up with urgent care as needed Final Clinical Impressions(s) / UC Diagnoses   Final diagnoses:  None   Discharge Instructions   None    ED Prescriptions   None    PDMP not reviewed this encounter.   Valinda Hoar, NP 05/27/21 1649

## 2021-05-27 NOTE — ED Triage Notes (Signed)
Pt states she has had cough for weeks and now it has turned into SOB, tightness in her lungs.  Has severe coughing fits and uses inhaler but no relief.  No known fevers.  (Not dx with asthma but has inhaler for periodic wheezing over last few weeks.)

## 2021-05-27 NOTE — Discharge Instructions (Signed)
Take azithromycin daily as prescribed  Starting tomorrow take prednisone every morning with food for the next 5 days this medicine is to help calm inflammation and wheezing  You may take 2 puffs albuterol inhaler every 4-6 hours as needed for shortness of breath and wheezing  You may use Tessalon pill every 8 hours to calm coughing  You may use cough syrup every 6 hours as needed, be mindful this medication will make you drowsy, if this occurs you may use at bedtime  May follow-up with urgent care as needed

## 2021-05-28 NOTE — Telephone Encounter (Signed)
Requested medication (s) are due for refill today: yes  Requested medication (s) are on the active medication list: yes  Last refill:  05/03/21 #90 with 0 RF  Future visit scheduled: 06/01/21  Notes to clinic:  Has appt this Tuesday, 06/01/21, unsure if this med was to be continued or used for episode, please assess.      Requested Prescriptions  Pending Prescriptions Disp Refills   propranolol (INDERAL) 10 MG tablet [Pharmacy Med Name: PROPRANOLOL 10 MG TABLET] 90 tablet 0    Sig: TAKE 1 TABLET BY MOUTH THREE TIMES A DAY AS NEEDED     Cardiovascular:  Beta Blockers Passed - 05/27/2021 12:35 PM      Passed - Last BP in normal range    BP Readings from Last 1 Encounters:  05/27/21 (!) 129/91          Passed - Last Heart Rate in normal range    Pulse Readings from Last 1 Encounters:  05/27/21 60          Passed - Valid encounter within last 6 months    Recent Outpatient Visits           1 week ago Severe depression [facility])   [facility] Gabriel Cirri, NP   3 weeks ago Panic anxiety syndrome   [facility] Gabriel Cirri, NP   1 month ago Epigastric pain   [facility] Margarita Mail, DO   5 months ago Annual physical exam   [facility] Danelle Berry, PA-C   1 year ago Bronchitis with asthma, subacute   [facility] Danelle Berry, PA-C       Future Appointments             In 4 days Zane Herald, Rudolpho Sevin, FNP [facility]

## 2021-06-01 ENCOUNTER — Ambulatory Visit: Payer: BC Managed Care – PPO | Admitting: Nurse Practitioner

## 2021-06-03 ENCOUNTER — Ambulatory Visit: Payer: Self-pay | Admitting: *Deleted

## 2021-06-03 NOTE — Telephone Encounter (Signed)
Second attempt to contact patient- left message for patient to call office.

## 2021-06-03 NOTE — Telephone Encounter (Signed)
°  Chief Complaint: yeast infection Symptoms: vaginal itching and discharge Frequency: 2-3 days Pertinent Negatives: NA Disposition: [] ED /[] Urgent Care (no appt availability in office) / [] Appointment(In office/virtual)/ [x]  [facility]/ [] Home Care/ [] Refused Recommended Disposition /[] [facility]/ []  Follow-up with PCP Additional Notes: pt states she has been eating yogurt but it hasn't helped with symptoms. No availability with office until 06/06/21.    Reason for Disposition  [1] Symptoms of a yeast infection (i.e., itchy, white discharge, not bad smelling) AND [2] not improved > 3 days following CARE ADVICE  Answer Assessment - Initial Assessment Questions 1. SYMPTOM: "What's the main symptom you're concerned about?" (e.g., pain, itching, dryness)     Itching discharge 2. LOCATION: "Where is the  sx located?" (e.g., inside/outside, left/right)     vaginally 3. ONSET: "When did the  sx  start?"     Wednesday 4. PAIN: "Is there any pain?" If Yes, ask: "How bad is it?" (Scale: 1-10; mild, moderate, severe)     mild 5. ITCHING: "Is there any itching?" If Yes, ask: "How bad is it?" (Scale: 1-10; mild, moderate, severe)     6/7 6. CAUSE: "What do you think is causing the discharge?" "Have you had the same problem before? What happened then?"     Yeast infection 7. OTHER SYMPTOMS: "Do you have any other symptoms?" (e.g., fever, itching, vaginal bleeding, pain with urination, injury to genital area, vaginal foreign body)     Pain with urination at times  Protocols used: Vaginal Symptoms-A-AH

## 2021-06-03 NOTE — Telephone Encounter (Signed)
Third attempt to reach patient- left message to call office. 

## 2021-06-03 NOTE — Telephone Encounter (Signed)
Summary: patient requesting medication for yeast infection   Patient called in to inquire of Dr Carlynn Purl or other provider if she can get an Rx sent to her pharmacy for a yeast infection that she have gotten while she is taking a Z pack. Please advise call patient at Ph# [phone]     Attempted to call patient- left message to call office

## 2021-06-08 ENCOUNTER — Ambulatory Visit: Payer: BC Managed Care – PPO | Admitting: Nurse Practitioner

## 2021-06-08 ENCOUNTER — Other Ambulatory Visit: Payer: Self-pay

## 2021-06-08 ENCOUNTER — Encounter: Payer: Self-pay | Admitting: Nurse Practitioner

## 2021-06-08 VITALS — BP 110/70 | HR 71 | Temp 98.2°F | Resp 18 | Wt 124.8 lb

## 2021-06-08 DIAGNOSIS — F410 Panic disorder [episodic paroxysmal anxiety] without agoraphobia: Secondary | ICD-10-CM | POA: Diagnosis not present

## 2021-06-08 DIAGNOSIS — F322 Major depressive disorder, single episode, severe without psychotic features: Secondary | ICD-10-CM

## 2021-06-08 MED ORDER — CITALOPRAM HYDROBROMIDE 20 MG PO TABS: 20.0000 mg | ORAL_TABLET | Freq: Every day | ORAL | 0 refills | Status: DC

## 2021-06-08 MED ORDER — BUSPIRONE HCL 5 MG PO TABS: 5.0000 mg | ORAL_TABLET | Freq: Three times a day (TID) | ORAL | 0 refills | Status: DC | PRN

## 2021-06-08 NOTE — Progress Notes (Signed)
? ?BP 110/70   Pulse 71   Temp 98.2 ?F (36.8 ?C)   Resp 18   Wt 124 lb 12.8 oz (56.6 kg)   SpO2 98%   BMI 20.77 kg/m?   ? ?Subjective:  ? ? Patient ID: Leslie [name], female    DOB: 09-May-1985, 36 y.o.   MRN: [id number] ? ?HPI: ?Leslie [name] is a 36 y.o. female ? ?Chief Complaint  ?Patient presents with  ? Follow-up  ? Depression  ? ?Depression/anxiety: She has been on celexa 20 mg daily for about a month. Her PHQ9 score has improved. She says she still has days where she feels sad but she says it is much better. She denies any suicidal thoughts. She says she has noticed that she is snapping more at her family. She says she gets anxious and agitated.  Discussed adding buspar prn for harder days with agitation.  She is agreeable to plan. Discussed if she was doing any counseling, she says she talks with her pastor and her mom regarding her stress.  Discussed maybe asking her pastor about a counselor she can see so she can feel more free to discuss her stress and give her coping strategies.  She will follow up in 4 weeks to reevaluate.  ?Depression screen [facility] 2/9 06/08/2021 05/17/2021 05/03/2021 04/12/2021 12/23/2020  ?Decreased Interest '1 2 1 ' 0 0  ?Down, Depressed, Hopeless 0 3 1 0 0  ?PHQ - 2 Score '1 5 2 ' 0 0  ?Altered sleeping '1 3 1 ' 0 0  ?Tired, decreased energy '2 3 1 ' 0 0  ?Change in appetite '1 3 1 ' 0 0  ?Feeling bad or failure about yourself  0 0 0 0 0  ?Trouble concentrating 0 1 1 0 0  ?Moving slowly or fidgety/restless 0 0 0 0 0  ?Suicidal thoughts 0 0 - 0 0  ?PHQ-9 Score '5 15 6 ' 0 0  ?Difficult doing work/chores Somewhat difficult Somewhat difficult Somewhat difficult Not difficult at all Not difficult at all  ?Some recent data might be hidden  ?  ?GAD 7 : Generalized Anxiety Score 06/08/2021 05/17/2021 05/03/2021  ?Nervous, Anxious, on Edge '2 2 3  ' ?Control/stop worrying 0 0 3  ?Worry too much - different things 0 0 1  ?Trouble relaxing '1 3 3  ' ?Restless 0 0 2  ?Easily annoyed or irritable '2 2 1  ' ?Afraid - awful might  happen 0 0 0  ?Total GAD 7 Score '5 7 13  ' ?Anxiety Difficulty Somewhat difficult Somewhat difficult Somewhat difficult  ? ?  ?Relevant past medical, surgical, family and social history reviewed and updated as indicated. Interim medical history since our last visit reviewed. ?Allergies and medications reviewed and updated. ? ?Review of Systems ? ?Constitutional: Negative for fever or weight change.  ?Respiratory: Negative for cough and shortness of breath.   ?Cardiovascular: Negative for chest pain or palpitations.  ?Gastrointestinal: Negative for abdominal pain, no bowel changes.  ?Musculoskeletal: Negative for gait problem or joint swelling.  ?Skin: Negative for rash.  ?Neurological: Negative for dizziness or headache.  ?No other specific complaints in a complete review of systems (except as listed in HPI above).  ? ?   ?Objective:  ?  ?BP 110/70   Pulse 71   Temp 98.2 ?F (36.8 ?C)   Resp 18   Wt 124 lb 12.8 oz (56.6 kg)   SpO2 98%   BMI 20.77 kg/m?   ?Wt Readings from Last 3 Encounters:  ?06/08/21 124 lb 12.8  oz (56.6 kg)  ?05/17/21 121 lb 6.4 oz (55.1 kg)  ?05/03/21 125 lb 9.6 oz (57 kg)  ?  ?Physical Exam ? ?Constitutional: Patient appears well-developed and well-nourished.  No distress.  ?HEENT: head atraumatic, normocephalic, pupils equal and reactive to light, neck supple ?Cardiovascular: Normal rate, regular rhythm and normal heart sounds.  No murmur heard. No BLE edema. ?Pulmonary/Chest: Effort normal and breath sounds normal. No respiratory distress. ?Abdominal: Soft.  There is no tenderness. ?Psychiatric: Patient has a normal mood and affect. behavior is normal. Judgment and thought content normal.  ? ?Results for orders placed or performed in visit on 05/03/21  ?CBC with Differential/Platelet  ?Result Value Ref Range  ? WBC 7.1 3.8 - 10.8 Thousand/uL  ? RBC 4.91 3.80 - 5.10 Million/uL  ? Hemoglobin 14.4 11.7 - 15.5 g/dL  ? HCT 44.2 35.0 - 45.0 %  ? MCV 90.0 80.0 - 100.0 fL  ? MCH 29.3 27.0 - 33.0 pg   ? MCHC 32.6 32.0 - 36.0 g/dL  ? RDW 12.3 11.0 - 15.0 %  ? Platelets 296 140 - 400 Thousand/uL  ? MPV 10.2 7.5 - 12.5 fL  ? Neutro Abs 4,984 1,500 - 7,800 cells/uL  ? Lymphs Abs 1,548 850 - 3,900 cells/uL  ? Absolute Monocytes 412 200 - 950 cells/uL  ? Eosinophils Absolute 114 15 - 500 cells/uL  ? Basophils Absolute 43 0 - 200 cells/uL  ? Neutrophils Relative % 70.2 %  ? Total Lymphocyte 21.8 %  ? Monocytes Relative 5.8 %  ? Eosinophils Relative 1.6 %  ? Basophils Relative 0.6 %  ?COMPLETE METABOLIC PANEL WITH GFR  ?Result Value Ref Range  ? Glucose, Bld 90 65 - 99 mg/dL  ? BUN 9 7 - 25 mg/dL  ? Creat 0.75 0.50 - 0.97 mg/dL  ? eGFR 105 > OR = 60 mL/min/1.60m  ? BUN/Creatinine Ratio NOT APPLICABLE 6 - 22 (calc)  ? Sodium 138 135 - 146 mmol/L  ? Potassium 4.2 3.5 - 5.3 mmol/L  ? Chloride 103 98 - 110 mmol/L  ? CO2 33 (H) 20 - 32 mmol/L  ? Calcium 10.1 8.6 - 10.2 mg/dL  ? Total Protein 7.1 6.1 - 8.1 g/dL  ? Albumin 4.9 3.6 - 5.1 g/dL  ? Globulin 2.2 1.9 - 3.7 g/dL (calc)  ? AG Ratio 2.2 1.0 - 2.5 (calc)  ? Total Bilirubin 0.6 0.2 - 1.2 mg/dL  ? Alkaline phosphatase (APISO) 46 31 - 125 U/L  ? AST 17 10 - 30 U/L  ? ALT 13 6 - 29 U/L  ?TSH  ?Result Value Ref Range  ? TSH 2.44 mIU/L  ?Lipid panel  ?Result Value Ref Range  ? Cholesterol 176 <200 mg/dL  ? HDL 58 > OR = 50 mg/dL  ? Triglycerides 114 <150 mg/dL  ? LDL Cholesterol (Calc) 97 mg/dL (calc)  ? Total CHOL/HDL Ratio 3.0 <5.0 (calc)  ? Non-HDL Cholesterol (Calc) 118 <130 mg/dL (calc)  ? ?   ?Assessment & Plan:  ? ?1. Severe depression ([place] ? ?- citalopram (CELEXA) 20 MG tablet; Take 1 tablet (20 mg total) by mouth daily.  Dispense: 30 tablet; Refill: 0 ? ?2. Panic anxiety syndrome ? ?- busPIRone (BUSPAR) 5 MG tablet; Take 1 tablet (5 mg total) by mouth 3 (three) times daily as needed.  Dispense: 90 tablet; Refill: 0  ? ?Follow up plan: ?Return in about 4 weeks (around 07/06/2021) for follow up. ? ? ? ? ? ?

## 2021-06-30 ENCOUNTER — Other Ambulatory Visit: Payer: Self-pay | Admitting: Nurse Practitioner

## 2021-06-30 DIAGNOSIS — F410 Panic disorder [episodic paroxysmal anxiety] without agoraphobia: Secondary | ICD-10-CM

## 2021-07-02 NOTE — Telephone Encounter (Signed)
Requested medications are due for refill today.  Too soon ? ?Requested medications are on the active medications list.  yes ? ?Last refill. 06/08/2021 #90 1 refill ? ?Future visit scheduled.   yes ? ?Notes to clinic.  Pharmacy needs DX code. ? ? ? ?Requested Prescriptions  ?Pending Prescriptions Disp Refills  ? busPIRone (BUSPAR) 5 MG tablet [Pharmacy Med Name: BUSPIRONE HCL 5 MG TABLET] 270 tablet 1  ?  Sig: TAKE 1 TABLET BY MOUTH 3 TIMES DAILY AS NEEDED.  ?  ? Psychiatry: Anxiolytics/Hypnotics - Non-controlled Passed - 06/30/2021  1:36 PM  ?  ?  Passed - Valid encounter within last 12 months  ?  Recent Outpatient Visits   ? ?      ? 3 weeks ago Severe depression (HCC)  ? [facility] Della Goo F, FNP  ? 1 month ago Severe depression [facility])  ? [facility] [place], Elnita Maxwell, NP  ? 2 months ago Panic anxiety syndrome  ? [facility] Gabriel Cirri, NP  ? 2 months ago Epigastric pain  ? [facility] Margarita Mail, DO  ? 6 months ago Annual physical exam  ? [facility] Danelle Berry, [place]  ? ?  ?  ?Future Appointments   ? ?        ? In 4 days Berniece Salines, FNP [facility], [facility]  ? ?  ? ?  ?  ?  ?  ?

## 2021-07-02 NOTE — Telephone Encounter (Signed)
Requested Prescriptions  ?Pending Prescriptions Disp Refills  ?? propranolol (INDERAL) 10 MG tablet [Pharmacy Med Name: PROPRANOLOL 10 MG TABLET] 90 tablet 0  ?  Sig: TAKE 1 TABLET BY MOUTH THREE TIMES A DAY AS NEEDED  ?  ? Cardiovascular:  Beta Blockers Passed - 06/30/2021  1:34 PM  ?  ?  Passed - Last BP in normal range  ?  BP Readings from Last 1 Encounters:  ?06/08/21 110/70  ?   ?  ?  Passed - Last Heart Rate in normal range  ?  Pulse Readings from Last 1 Encounters:  ?06/08/21 71  ?   ?  ?  Passed - Valid encounter within last 6 months  ?  Recent Outpatient Visits   ?      ? 3 weeks ago Severe depression (HCC)  ? [facility] Della Goo F, FNP  ? 1 month ago Severe depression [facility])  ? [facility] [place], Elnita Maxwell, NP  ? 2 months ago Panic anxiety syndrome  ? [facility] Gabriel Cirri, NP  ? 2 months ago Epigastric pain  ? [facility] Margarita Mail, DO  ? 6 months ago Annual physical exam  ? [facility] Danelle Berry, [place]  ?  ?  ?Future Appointments   ?        ? In 4 days Berniece Salines, FNP [facility], [facility]  ?  ? ?  ?  ?  ? ?

## 2021-07-05 ENCOUNTER — Other Ambulatory Visit: Payer: Self-pay | Admitting: Family Medicine

## 2021-07-06 ENCOUNTER — Ambulatory Visit: Payer: BC Managed Care – PPO | Admitting: Nurse Practitioner

## 2021-07-06 ENCOUNTER — Other Ambulatory Visit: Payer: Self-pay

## 2021-07-06 ENCOUNTER — Encounter: Payer: Self-pay | Admitting: Nurse Practitioner

## 2021-07-06 VITALS — BP 110/70 | HR 77 | Temp 98.2°F | Resp 18 | Ht 65.0 in | Wt 127.5 lb

## 2021-07-06 DIAGNOSIS — F5105 Insomnia due to other mental disorder: Secondary | ICD-10-CM | POA: Diagnosis not present

## 2021-07-06 DIAGNOSIS — F99 Mental disorder, not otherwise specified: Secondary | ICD-10-CM | POA: Diagnosis not present

## 2021-07-06 DIAGNOSIS — F331 Major depressive disorder, recurrent, moderate: Secondary | ICD-10-CM

## 2021-07-06 MED ORDER — HYDROXYZINE PAMOATE 25 MG PO CAPS: 25.0000 mg | ORAL_CAPSULE | Freq: Every evening | ORAL | 0 refills | Status: DC | PRN

## 2021-07-06 MED ORDER — CITALOPRAM HYDROBROMIDE 40 MG PO TABS: 40.0000 mg | ORAL_TABLET | Freq: Every day | ORAL | 0 refills | Status: DC

## 2021-07-06 NOTE — Progress Notes (Signed)
? ?BP 110/70   Pulse 77   Temp 98.2 ?F (36.8 ?C) (Oral)   Resp 18   Ht '5\' 5"'  (1.651 m)   Wt 127 lb 8 oz (57.8 kg)   SpO2 99%   BMI 21.22 kg/m?   ? ?Subjective:  ? ? Patient ID: Leslie [name], female    DOB: December 02, 1985, 36 y.o.   MRN: [id number] ? ?HPI: ?Leslie [name] is a 36 y.o. female ? ?Chief Complaint  ?Patient presents with  ? Depression  ?  4 week follow up  ? ?Depression/ insomnia: Her PHQ9 and GAD scores have increased since last visit. She says over the last few weeks a lot has happened. She says that her step father has terminal cancer and has recently had to stop treatment due to diarrhea, he then had scans done which have worsened.  She says that they also recently buried her step fathers father.  She says she had to stop the buspar because it was causing headaches.  She says she is still snapping for no reason.  She denies any suicidal thoughts. She says she would like to go up on the dose of celexa.  She also says she is really having trouble sleeping. She says some nights her brain will not shut off and she just cannot go to sleep. Discussed trying hydroxyzine to help her sleep at night. She will follow up in four weeks.  She say she is still talking to her pastor but she is looking for a therapist.   ? ?  07/06/2021  ?  9:54 AM 06/08/2021  ?  1:52 PM 05/17/2021  ?  1:46 PM 05/03/2021  ?  9:29 AM 04/12/2021  ?  1:19 PM  ?Depression screen PHQ 2/9  ?Decreased Interest '2 1 2 1 ' 0  ?Down, Depressed, Hopeless 2 0 3 1 0  ?PHQ - 2 Score '4 1 5 2 ' 0  ?Altered sleeping '2 1 3 1 ' 0  ?Tired, decreased energy '3 2 3 1 ' 0  ?Change in appetite '2 1 3 1 ' 0  ?Feeling bad or failure about yourself  1 0 0 0 0  ?Trouble concentrating 2 0 1 1 0  ?Moving slowly or fidgety/restless 0 0 0 0 0  ?Suicidal thoughts 0 0 0  0  ?PHQ-9 Score '14 5 15 6 ' 0  ?Difficult doing work/chores Somewhat difficult Somewhat difficult Somewhat difficult Somewhat difficult Not difficult at all  ?  ? ?  07/06/2021  ?  9:54 AM 06/08/2021  ?  1:53 PM 05/17/2021   ?  2:01 PM 05/03/2021  ?  9:38 AM  ?GAD 7 : Generalized Anxiety Score  ?Nervous, Anxious, on Edge '2 2 2 3  ' ?Control/stop worrying 1 0 0 3  ?Worry too much - different things 1 0 0 1  ?Trouble relaxing '2 1 3 3  ' ?Restless 1 0 0 2  ?Easily annoyed or irritable '2 2 2 1  ' ?Afraid - awful might happen 0 0 0 0  ?Total GAD 7 Score '9 5 7 13  ' ?Anxiety Difficulty Somewhat difficult Somewhat difficult Somewhat difficult Somewhat difficult  ? ?  ?Relevant past medical, surgical, family and social history reviewed and updated as indicated. Interim medical history since our last visit reviewed. ?Allergies and medications reviewed and updated. ? ?Review of Systems ? ?Constitutional: Negative for fever or weight change.  ?Respiratory: Negative for cough and shortness of breath.   ?Cardiovascular: Negative for chest pain or palpitations.  ?Gastrointestinal: Negative for  abdominal pain, no bowel changes.  ?Musculoskeletal: Negative for gait problem or joint swelling.  ?Skin: Negative for rash.  ?Neurological: Negative for dizziness or headache.  ?No other specific complaints in a complete review of systems (except as listed in HPI above).  ? ?   ?Objective:  ?  ?BP 110/70   Pulse 77   Temp 98.2 ?F (36.8 ?C) (Oral)   Resp 18   Ht '5\' 5"'  (1.651 m)   Wt 127 lb 8 oz (57.8 kg)   SpO2 99%   BMI 21.22 kg/m?   ?Wt Readings from Last 3 Encounters:  ?07/06/21 127 lb 8 oz (57.8 kg)  ?06/08/21 124 lb 12.8 oz (56.6 kg)  ?05/17/21 121 lb 6.4 oz (55.1 kg)  ?  ?Physical Exam ? ?Constitutional: Patient appears well-developed and well-nourished.  No distress.  ?HEENT: head atraumatic, normocephalic, pupils equal and reactive to light, neck supple ?Cardiovascular: Normal rate, regular rhythm and normal heart sounds.  No murmur heard. No BLE edema. ?Pulmonary/Chest: Effort normal and breath sounds normal. No respiratory distress. ?Abdominal: Soft.  There is no tenderness. ?Psychiatric: Patient has a normal mood and affect. behavior is normal.  Judgment and thought content normal.  ? ?Results for orders placed or performed in visit on 05/03/21  ?CBC with Differential/Platelet  ?Result Value Ref Range  ? WBC 7.1 3.8 - 10.8 Thousand/uL  ? RBC 4.91 3.80 - 5.10 Million/uL  ? Hemoglobin 14.4 11.7 - 15.5 g/dL  ? HCT 44.2 35.0 - 45.0 %  ? MCV 90.0 80.0 - 100.0 fL  ? MCH 29.3 27.0 - 33.0 pg  ? MCHC 32.6 32.0 - 36.0 g/dL  ? RDW 12.3 11.0 - 15.0 %  ? Platelets 296 140 - 400 Thousand/uL  ? MPV 10.2 7.5 - 12.5 fL  ? Neutro Abs 4,984 1,500 - 7,800 cells/uL  ? Lymphs Abs 1,548 850 - 3,900 cells/uL  ? Absolute Monocytes 412 200 - 950 cells/uL  ? Eosinophils Absolute 114 15 - 500 cells/uL  ? Basophils Absolute 43 0 - 200 cells/uL  ? Neutrophils Relative % 70.2 %  ? Total Lymphocyte 21.8 %  ? Monocytes Relative 5.8 %  ? Eosinophils Relative 1.6 %  ? Basophils Relative 0.6 %  ?COMPLETE METABOLIC PANEL WITH GFR  ?Result Value Ref Range  ? Glucose, Bld 90 65 - 99 mg/dL  ? BUN 9 7 - 25 mg/dL  ? Creat 0.75 0.50 - 0.97 mg/dL  ? eGFR 105 > OR = 60 mL/min/1.24m  ? BUN/Creatinine Ratio NOT APPLICABLE 6 - 22 (calc)  ? Sodium 138 135 - 146 mmol/L  ? Potassium 4.2 3.5 - 5.3 mmol/L  ? Chloride 103 98 - 110 mmol/L  ? CO2 33 (H) 20 - 32 mmol/L  ? Calcium 10.1 8.6 - 10.2 mg/dL  ? Total Protein 7.1 6.1 - 8.1 g/dL  ? Albumin 4.9 3.6 - 5.1 g/dL  ? Globulin 2.2 1.9 - 3.7 g/dL (calc)  ? AG Ratio 2.2 1.0 - 2.5 (calc)  ? Total Bilirubin 0.6 0.2 - 1.2 mg/dL  ? Alkaline phosphatase (APISO) 46 31 - 125 U/L  ? AST 17 10 - 30 U/L  ? ALT 13 6 - 29 U/L  ?TSH  ?Result Value Ref Range  ? TSH 2.44 mIU/L  ?Lipid panel  ?Result Value Ref Range  ? Cholesterol 176 <200 mg/dL  ? HDL 58 > OR = 50 mg/dL  ? Triglycerides 114 <150 mg/dL  ? LDL Cholesterol (Calc) 97 mg/dL (calc)  ? Total  CHOL/HDL Ratio 3.0 <5.0 (calc)  ? Non-HDL Cholesterol (Calc) 118 <130 mg/dL (calc)  ? ?   ?Assessment & Plan:  ? ?1. Moderate episode of recurrent major depressive disorder ([place]) ? ?- citalopram (CELEXA) 40 MG tablet; Take 1  tablet (40 mg total) by mouth daily.  Dispense: 30 tablet; Refill: 0 ? ?2. Insomnia due to other mental disorder ? ?- hydrOXYzine (VISTARIL) 25 MG capsule; Take 1 capsule (25 mg total) by mouth at bedtime as needed for anxiety.  Dispense: 30 capsule; Refill: 0  ? ?Follow up plan: ?Return in about 4 weeks (around 08/03/2021) for follow up. ? ? ? ? ? ?

## 2021-07-08 ENCOUNTER — Encounter: Payer: Self-pay | Admitting: Nurse Practitioner

## 2021-07-20 ENCOUNTER — Telehealth: Payer: Self-pay | Admitting: Nurse Practitioner

## 2021-07-20 DIAGNOSIS — F5105 Insomnia due to other mental disorder: Secondary | ICD-10-CM

## 2021-07-20 NOTE — Telephone Encounter (Signed)
Just trying med, has upcoming appt to assess. ?Requested Prescriptions  ?Pending Prescriptions Disp Refills  ?? hydrOXYzine (VISTARIL) 25 MG capsule [Pharmacy Med Name: HYDROXYZINE PAM 25 MG CAP] 90 capsule 1  ?  Sig: TAKE 1 CAPSULE (25 MG TOTAL) BY MOUTH AT BEDTIME AS NEEDED FOR ANXIETY.  ?  ? Ear, Nose, and Throat:  Antihistamines 2 Passed - 07/20/2021  9:09 AM  ?  ?  Passed - Cr in normal range and within 360 days  ?  Creat  ?Date Value Ref Range Status  ?05/03/2021 0.75 0.50 - 0.97 mg/dL Final  ?   ?  ?  Passed - Valid encounter within last 12 months  ?  Recent Outpatient Visits   ?      ? 2 weeks ago Moderate episode of recurrent major depressive disorder (HCC)  ? [facility] Della Goo F, FNP  ? 1 month ago Severe depression [facility])  ? [facility] Della Goo F, FNP  ? 2 months ago Severe depression [facility])  ? [facility] [place], Elnita Maxwell, NP  ? 2 months ago Panic anxiety syndrome  ? [facility] [place], Elnita Maxwell, NP  ? 3 months ago Epigastric pain  ? [facility] Margarita Mail, DO  ?  ?  ?Future Appointments   ?        ? In 2 weeks Zane Herald Rudolpho Sevin, FNP [facility], [facility]  ?  ? ?  ?  ?  ? ? ?

## 2021-07-29 ENCOUNTER — Other Ambulatory Visit: Payer: Self-pay | Admitting: Nurse Practitioner

## 2021-07-29 ENCOUNTER — Other Ambulatory Visit: Payer: Self-pay | Admitting: Family Medicine

## 2021-07-29 DIAGNOSIS — F331 Major depressive disorder, recurrent, moderate: Secondary | ICD-10-CM

## 2021-07-29 NOTE — Telephone Encounter (Signed)
Ins requesting 90 day supply 

## 2021-07-29 NOTE — Telephone Encounter (Signed)
Requested medication (s) are due for refill today:   Yes ? ?Requested medication (s) are on the active medication list:   Yes ? ?Future visit scheduled:   Yes ? ? ?Last ordered: 07/06/2021 #30, 0 refills ? ?Returned because pharmacy requesting a 90 day supply and a DX Code  ? ?Requested Prescriptions  ?Pending Prescriptions Disp Refills  ? citalopram (CELEXA) 40 MG tablet [Pharmacy Med Name: CITALOPRAM HBR 40 MG TABLET] 90 tablet 1  ?  Sig: TAKE 1 TABLET BY MOUTH EVERY DAY  ?  ? Psychiatry:  Antidepressants - SSRI Passed - 07/29/2021 10:33 AM  ?  ?  Passed - Completed PHQ-2 or PHQ-9 in the last 360 days  ?  ?  Passed - Valid encounter within last 6 months  ?  Recent Outpatient Visits   ? ?      ? 3 weeks ago Moderate episode of recurrent major depressive disorder (HCC)  ? [facility] Della Goo F, FNP  ? 1 month ago Severe depression [facility])  ? [facility] Della Goo F, FNP  ? 2 months ago Severe depression [facility])  ? [facility] [place], Elnita Maxwell, NP  ? 2 months ago Panic anxiety syndrome  ? [facility] [place], Elnita Maxwell, NP  ? 3 months ago Epigastric pain  ? [facility] Margarita Mail, DO  ? ?  ?  ?Future Appointments   ? ?        ? In 1 week Zane Herald Rudolpho Sevin, FNP [facility], [facility]  ? ?  ? ? ?  ?  ?  ? ?

## 2021-08-05 ENCOUNTER — Other Ambulatory Visit: Payer: Self-pay | Admitting: Nurse Practitioner

## 2021-08-05 ENCOUNTER — Other Ambulatory Visit: Payer: Self-pay

## 2021-08-05 ENCOUNTER — Encounter: Payer: Self-pay | Admitting: Nurse Practitioner

## 2021-08-05 ENCOUNTER — Ambulatory Visit: Payer: BC Managed Care – PPO | Admitting: Nurse Practitioner

## 2021-08-05 VITALS — BP 112/72 | HR 77 | Temp 98.3°F | Resp 16 | Ht 65.0 in | Wt 125.6 lb

## 2021-08-05 DIAGNOSIS — F419 Anxiety disorder, unspecified: Secondary | ICD-10-CM | POA: Diagnosis not present

## 2021-08-05 DIAGNOSIS — F331 Major depressive disorder, recurrent, moderate: Secondary | ICD-10-CM | POA: Diagnosis not present

## 2021-08-05 MED ORDER — CARIPRAZINE HCL 1.5 MG PO CAPS: 1.5000 mg | ORAL_CAPSULE | Freq: Every day | ORAL | 0 refills | Status: DC

## 2021-08-05 NOTE — Telephone Encounter (Signed)
Requested medication (s) are due for refill today: No ? ?Requested medication (s) are on the active medication list: Yes ? ?Last refill:  08/05/21 ? ?Future visit scheduled: Yes ? ?Notes to clinic:  Pharmacy requests prior authorization. ? ? ? ?Requested Prescriptions  ?Pending Prescriptions Disp Refills  ? VRAYLAR 1.5 MG capsule [Pharmacy Med Name: VRAYLAR 1.5 MG CAPSULE] 45 capsule 0  ?  Sig: TAKE 1 CAPSULE BY MOUTH DAILY.  ?  ? Off-Protocol Failed - 08/05/2021 11:17 AM  ?  ?  Failed - Medication not assigned to a protocol, review manually.  ?  ?  Passed - Valid encounter within last 12 months  ?  Recent Outpatient Visits   ? ?      ? Today Moderate episode of recurrent major depressive disorder (HCC)  ? [facility] Della Goo F, FNP  ? 1 month ago Moderate episode of recurrent major depressive disorder (HCC)  ? [facility] Della Goo F, FNP  ? 1 month ago Severe depression [facility])  ? [facility] Della Goo F, FNP  ? 2 months ago Severe depression [facility])  ? [facility] [place], Elnita Maxwell, NP  ? 3 months ago Panic anxiety syndrome  ? [facility] Gabriel Cirri, NP  ? ?  ?  ?Future Appointments   ? ?        ? In 1 month Danelle Berry, PA-C [facility], [facility]  ? ?  ? ? ?  ?  ?  ? ?

## 2021-08-05 NOTE — Progress Notes (Signed)
? ?BP 112/72   Pulse 77   Temp 98.3 ?F (36.8 ?C) (Oral)   Resp 16   Ht '5\' 5"'  (1.651 m)   Wt 125 lb 9.6 oz (57 kg)   SpO2 97%   BMI 20.90 kg/m?   ? ?Subjective:  ? ? Patient ID: Leslie [name], female    DOB: 1986-02-05, 36 y.o.   MRN: [id number] ? ?HPI: ?Leslie [name] is a 36 y.o. female ? ?Chief Complaint  ?Patient presents with  ? Depression  ? Anxiety  ?  4 week follow up  ? ?Depression/anxiety: She is currently taking Celexa 40 mg daily. She says she is doing a little better.  She says she still snapping at people and she still crying a lot.  She denies any suicidal thoughts.  Last visit we tried to add hydroxyzine she did not like how that made her feel.  She says she is working on getting into therapy.  She has a friend coming over to help her go through her insurance next week.  Discussed trying vraylar as adjunct therapy.  Discussed side effects.  She is agreeable to try.  Follow-up scheduled in 6 weeks.  She knows to come back sooner if she has issues. ? ?  08/05/2021  ? 10:39 AM 07/06/2021  ?  9:54 AM 06/08/2021  ?  1:52 PM 05/17/2021  ?  1:46 PM 05/03/2021  ?  9:29 AM  ?Depression screen PHQ 2/9  ?Decreased Interest '1 2 1 2 1  ' ?Down, Depressed, Hopeless 2 2 0 3 1  ?PHQ - 2 Score '3 4 1 5 2  ' ?Altered sleeping '1 2 1 3 1  ' ?Tired, decreased energy '1 3 2 3 1  ' ?Change in appetite '2 2 1 3 1  ' ?Feeling bad or failure about yourself  1 1 0 0 0  ?Trouble concentrating 0 2 0 1 1  ?Moving slowly or fidgety/restless 0 0 0 0 0  ?Suicidal thoughts 0 0 0 0   ?PHQ-9 Score '8 14 5 15 6  ' ?Difficult doing work/chores Somewhat difficult Somewhat difficult Somewhat difficult Somewhat difficult Somewhat difficult  ?  ? ?  08/05/2021  ? 10:40 AM 07/06/2021  ?  9:54 AM 06/08/2021  ?  1:53 PM 05/17/2021  ?  2:01 PM  ?GAD 7 : Generalized Anxiety Score  ?Nervous, Anxious, on Edge '2 2 2 2  ' ?Control/stop worrying 1 1 0 0  ?Worry too much - different things 0 1 0 0  ?Trouble relaxing '1 2 1 3  ' ?Restless 0 1 0 0  ?Easily annoyed or irritable '2 2 2  2  ' ?Afraid - awful might happen 0 0 0 0  ?Total GAD 7 Score '6 9 5 7  ' ?Anxiety Difficulty Somewhat difficult Somewhat difficult Somewhat difficult Somewhat difficult  ? ?  ?Relevant past medical, surgical, family and social history reviewed and updated as indicated. Interim medical history since our last visit reviewed. ?Allergies and medications reviewed and updated. ? ?Review of Systems ? ?Constitutional: Negative for fever or weight change.  ?Respiratory: Negative for cough and shortness of breath.   ?Cardiovascular: Negative for chest pain or palpitations.  ?Gastrointestinal: Negative for abdominal pain, no bowel changes.  ?Musculoskeletal: Negative for gait problem or joint swelling.  ?Skin: Negative for rash.  ?Neurological: Negative for dizziness or headache.  ?No other specific complaints in a complete review of systems (except as listed in HPI above).  ? ?   ?Objective:  ?  ?BP 112/72  Pulse 77   Temp 98.3 ?F (36.8 ?C) (Oral)   Resp 16   Ht '5\' 5"'  (1.651 m)   Wt 125 lb 9.6 oz (57 kg)   SpO2 97%   BMI 20.90 kg/m?   ?Wt Readings from Last 3 Encounters:  ?08/05/21 125 lb 9.6 oz (57 kg)  ?07/06/21 127 lb 8 oz (57.8 kg)  ?06/08/21 124 lb 12.8 oz (56.6 kg)  ?  ?Physical Exam ? ?Constitutional: Patient appears well-developed and well-nourished.  No distress.  ?HEENT: head atraumatic, normocephalic, pupils equal and reactive to light, neck supple ?Cardiovascular: Normal rate, regular rhythm and normal heart sounds.  No murmur heard. No BLE edema. ?Pulmonary/Chest: Effort normal and breath sounds normal. No respiratory distress. ?Abdominal: Soft.  There is no tenderness. ?Psychiatric: Patient has a normal mood and affect. behavior is normal. Judgment and thought content normal.  ? ?Results for orders placed or performed in visit on 05/03/21  ?CBC with Differential/Platelet  ?Result Value Ref Range  ? WBC 7.1 3.8 - 10.8 Thousand/uL  ? RBC 4.91 3.80 - 5.10 Million/uL  ? Hemoglobin 14.4 11.7 - 15.5 g/dL  ? HCT  44.2 35.0 - 45.0 %  ? MCV 90.0 80.0 - 100.0 fL  ? MCH 29.3 27.0 - 33.0 pg  ? MCHC 32.6 32.0 - 36.0 g/dL  ? RDW 12.3 11.0 - 15.0 %  ? Platelets 296 140 - 400 Thousand/uL  ? MPV 10.2 7.5 - 12.5 fL  ? Neutro Abs 4,984 1,500 - 7,800 cells/uL  ? Lymphs Abs 1,548 850 - 3,900 cells/uL  ? Absolute Monocytes 412 200 - 950 cells/uL  ? Eosinophils Absolute 114 15 - 500 cells/uL  ? Basophils Absolute 43 0 - 200 cells/uL  ? Neutrophils Relative % 70.2 %  ? Total Lymphocyte 21.8 %  ? Monocytes Relative 5.8 %  ? Eosinophils Relative 1.6 %  ? Basophils Relative 0.6 %  ?COMPLETE METABOLIC PANEL WITH GFR  ?Result Value Ref Range  ? Glucose, Bld 90 65 - 99 mg/dL  ? BUN 9 7 - 25 mg/dL  ? Creat 0.75 0.50 - 0.97 mg/dL  ? eGFR 105 > OR = 60 mL/min/1.88m  ? BUN/Creatinine Ratio NOT APPLICABLE 6 - 22 (calc)  ? Sodium 138 135 - 146 mmol/L  ? Potassium 4.2 3.5 - 5.3 mmol/L  ? Chloride 103 98 - 110 mmol/L  ? CO2 33 (H) 20 - 32 mmol/L  ? Calcium 10.1 8.6 - 10.2 mg/dL  ? Total Protein 7.1 6.1 - 8.1 g/dL  ? Albumin 4.9 3.6 - 5.1 g/dL  ? Globulin 2.2 1.9 - 3.7 g/dL (calc)  ? AG Ratio 2.2 1.0 - 2.5 (calc)  ? Total Bilirubin 0.6 0.2 - 1.2 mg/dL  ? Alkaline phosphatase (APISO) 46 31 - 125 U/L  ? AST 17 10 - 30 U/L  ? ALT 13 6 - 29 U/L  ?TSH  ?Result Value Ref Range  ? TSH 2.44 mIU/L  ?Lipid panel  ?Result Value Ref Range  ? Cholesterol 176 <200 mg/dL  ? HDL 58 > OR = 50 mg/dL  ? Triglycerides 114 <150 mg/dL  ? LDL Cholesterol (Calc) 97 mg/dL (calc)  ? Total CHOL/HDL Ratio 3.0 <5.0 (calc)  ? Non-HDL Cholesterol (Calc) 118 <130 mg/dL (calc)  ? ?   ?Assessment & Plan:  ? ?1. Moderate episode of recurrent major depressive disorder ([place] ?Sample of Vraylar given to patient in office ?She was also given the coupon card ?- cariprazine (VRAYLAR) 1.5 MG capsule; Take 1  capsule (1.5 mg total) by mouth daily.  Dispense: 45 capsule; Refill: 0 ?Continue taking Celexa ?Continue to work on getting into therapy ?2. Anxiety ?Continue taking Celexa ?Continue to work  on getting into therapy ? ?Follow up plan: ?Return in about 6 weeks (around 09/16/2021) for follow up. ? ? ? ? ? ?

## 2021-08-08 ENCOUNTER — Other Ambulatory Visit: Payer: Self-pay

## 2021-08-08 DIAGNOSIS — F331 Major depressive disorder, recurrent, moderate: Secondary | ICD-10-CM

## 2021-08-09 ENCOUNTER — Other Ambulatory Visit: Payer: Self-pay

## 2021-08-11 ENCOUNTER — Encounter: Payer: Self-pay | Admitting: Nurse Practitioner

## 2021-08-11 ENCOUNTER — Other Ambulatory Visit: Payer: Self-pay | Admitting: Nurse Practitioner

## 2021-08-11 DIAGNOSIS — F419 Anxiety disorder, unspecified: Secondary | ICD-10-CM

## 2021-08-11 DIAGNOSIS — F331 Major depressive disorder, recurrent, moderate: Secondary | ICD-10-CM

## 2021-08-11 MED ORDER — SERTRALINE HCL 25 MG PO TABS: 25.0000 mg | ORAL_TABLET | Freq: Every day | ORAL | 0 refills | Status: DC

## 2021-08-17 ENCOUNTER — Encounter: Payer: Self-pay | Admitting: Nurse Practitioner

## 2021-08-18 ENCOUNTER — Ambulatory Visit: Payer: BC Managed Care – PPO | Admitting: Family Medicine

## 2021-08-18 ENCOUNTER — Encounter: Payer: Self-pay | Admitting: Family Medicine

## 2021-08-18 VITALS — BP 114/72 | HR 79 | Temp 98.1°F | Resp 14 | Ht 65.0 in | Wt 124.1 lb

## 2021-08-18 DIAGNOSIS — J45909 Unspecified asthma, uncomplicated: Secondary | ICD-10-CM | POA: Diagnosis not present

## 2021-08-18 DIAGNOSIS — J209 Acute bronchitis, unspecified: Secondary | ICD-10-CM

## 2021-08-18 DIAGNOSIS — J0140 Acute pansinusitis, unspecified: Secondary | ICD-10-CM

## 2021-08-18 MED ORDER — PREDNISONE 20 MG PO TABS: 40.0000 mg | ORAL_TABLET | Freq: Every day | ORAL | 0 refills | Status: AC

## 2021-08-18 MED ORDER — DOXYCYCLINE HYCLATE 100 MG PO TABS: 100.0000 mg | ORAL_TABLET | Freq: Two times a day (BID) | ORAL | 0 refills | Status: AC

## 2021-08-18 MED ORDER — ALBUTEROL SULFATE HFA 108 (90 BASE) MCG/ACT IN AERS: 2.0000 | INHALATION_SPRAY | RESPIRATORY_TRACT | 0 refills | Status: DC | PRN

## 2021-08-18 MED ORDER — LEVOCETIRIZINE DIHYDROCHLORIDE 5 MG PO TABS: 5.0000 mg | ORAL_TABLET | Freq: Every evening | ORAL | 1 refills | Status: DC

## 2021-08-23 NOTE — Progress Notes (Signed)
? ? ?Patient ID: Leslie [name], female    DOB: 02-Dec-1985, 36 y.o.   MRN: [id number] ? ?PCP: Delsa Grana, PA-C ? ?Chief Complaint  ?Patient presents with  ? Sinusitis  ? ? ?Subjective:  ? ?Leslie [name] is a 36 y.o. female, presents to clinic with CC of the following: ? ?Sinusitis ?This is a new problem. The current episode started 1 to 4 weeks ago. The problem has been gradually worsening since onset. There has been no fever. The pain is moderate. Associated symptoms include congestion, coughing, headaches, a hoarse voice, sinus pressure and sneezing. Pertinent negatives include no chills, diaphoresis, ear pain, neck pain, shortness of breath, sore throat or swollen glands. Past treatments include oral decongestants. The treatment provided no relief.   ? ? ?Patient Active Problem List  ? Diagnosis Date Noted  ? Insomnia due to other mental disorder 07/06/2021  ? Severe depression ([place]) 05/17/2021  ? Panic anxiety syndrome 05/03/2021  ? Hyperlipidemia 12/23/2019  ? Family history of thyroid disease 12/23/2019  ? Family history of diabetes mellitus in mother 12/23/2019  ? Gastroesophageal reflux disease without esophagitis 09/09/2018  ? Lumbosacral radiculopathy 01/07/2015  ? ? ? ? ?Current Outpatient Medications:  ?  doxycycline (VIBRA-TABS) 100 MG tablet, Take 1 tablet (100 mg total) by mouth 2 (two) times daily for 7 days., Disp: 14 tablet, Rfl: 0 ?  fluticasone (FLONASE) 50 MCG/ACT nasal spray, SPRAY 2 SPRAYS INTO EACH NOSTRIL EVERY DAY, Disp: 48 mL, Rfl: 1 ?  levocetirizine (XYZAL) 5 MG tablet, Take 1 tablet (5 mg total) by mouth every evening., Disp: 90 tablet, Rfl: 1 ?  levonorgestrel (MIRENA) 20 MCG/24HR IUD, 1 each by Intrauterine route once., Disp: , Rfl:  ?  predniSONE (DELTASONE) 20 MG tablet, Take 2 tablets (40 mg total) by mouth daily with breakfast for 5 days., Disp: 10 tablet, Rfl: 0 ?  propranolol (INDERAL) 10 MG tablet, TAKE 1 TABLET BY MOUTH THREE TIMES A DAY AS NEEDED, Disp: 90 tablet, Rfl: 0 ?   sertraline (ZOLOFT) 25 MG tablet, Take 1 tablet (25 mg total) by mouth daily., Disp: 30 tablet, Rfl: 0 ?  albuterol (VENTOLIN HFA) 108 (90 Base) MCG/ACT inhaler, Inhale 2 puffs into the lungs every 4 (four) hours as needed for wheezing or shortness of breath., Disp: 18 g, Rfl: 0 ? ? ?Allergies  ?Allergen Reactions  ? Amoxicillin Hives  ?  Has patient had a PCN reaction causing immediate rash, facial/tongue/throat swelling, SOB or lightheadedness with hypotension: {no ?Has patient had a PCN reaction causing severe rash involving mucus membranes or skin necrosis: no ?Has patient had a PCN reaction that required hospitalization no ?Has patient had a PCN reaction occurring within the last 10 years: no ?If all of the above answers are "NO", then may proceed with Cephalosporin use.  ? Bactrim [Sulfamethoxazole-Trimethoprim] Hives  ? Penicillins Hives  ?  Has patient had a PCN reaction causing immediate rash, facial/tongue/throat swelling, SOB or lightheadedness with hypotension: {no ?Has patient had a PCN reaction causing severe rash involving mucus membranes or skin necrosis: {no ?Has patient had a PCN reaction that required hospitalization no ?Has patient had a PCN reaction occurring within the last 10 years: {no ?If all of the above answers are "NO", then may proceed with Cephalosporin use.  ? Denture Adhesive Rash  ? Tape Rash  ?  PT TOLERATES PAPER TAPE WELL  ? ? ? ?Social History  ? ?Tobacco Use  ? Smoking status: Former  ?  Packs/day:  0.15  ?  Years: 15.00  ?  Pack years: 2.25  ?  Types: Cigarettes  ?  Quit date: 05/11/2020  ?  Years since quitting: 1.2  ? Smokeless tobacco: Never  ?Vaping Use  ? Vaping Use: Never used  ?Substance Use Topics  ? Alcohol use: Yes  ?  Alcohol/week: 7.0 standard drinks  ?  Types: 7 Glasses of wine per week  ?  Comment: 1 glass of wine  ? Drug use: No  ?  Comment: pt denies during phone interview but UDS was + for marijuana in 2016  ?  ? ? ?Chart Review Today: ?I personally reviewed  active problem list, medication list, allergies, family history, social history, health maintenance, notes from last encounter, lab results, imaging with the patient/caregiver today. ? ? ?Review of Systems  ?Constitutional: Negative.  Negative for chills and diaphoresis.  ?HENT:  Positive for congestion, hoarse voice, sinus pressure and sneezing. Negative for ear pain and sore throat.   ?Eyes: Negative.   ?Respiratory:  Positive for cough. Negative for shortness of breath.   ?Cardiovascular: Negative.   ?Gastrointestinal: Negative.   ?Endocrine: Negative.   ?Genitourinary: Negative.   ?Musculoskeletal: Negative.  Negative for neck pain.  ?Skin: Negative.   ?Allergic/Immunologic: Negative.   ?Neurological:  Positive for headaches.  ?Hematological: Negative.   ?Psychiatric/Behavioral: Negative.    ?All other systems reviewed and are negative. ? ?   ?Objective:  ? ?Vitals:  ? 08/18/21 1600  ?BP: 114/72  ?Pulse: 79  ?Resp: 14  ?Temp: 98.1 ?F (36.7 ?C)  ?TempSrc: Oral  ?SpO2: 96%  ?Weight: 124 lb 1.6 oz (56.3 kg)  ?Height: '5\' 5"'  (1.651 m)  ?  ?Body mass index is 20.65 kg/m?. ? ?Physical Exam ?Vitals and nursing note reviewed.  ?Constitutional:   ?   General: She is not in acute distress. ?   Appearance: She is well-developed. She is not ill-appearing, toxic-appearing or diaphoretic.  ?HENT:  ?   Head: Normocephalic and atraumatic.  ?   Right Ear: Hearing, tympanic membrane, ear canal and external ear normal.  ?   Left Ear: Hearing, tympanic membrane, ear canal and external ear normal.  ?   Nose: Mucosal edema, congestion and rhinorrhea present.  ?   Right Sinus: No maxillary sinus tenderness or frontal sinus tenderness.  ?   Left Sinus: No maxillary sinus tenderness or frontal sinus tenderness.  ?   Mouth/Throat:  ?   Mouth: Mucous membranes are moist. Mucous membranes are not pale.  ?   Pharynx: Uvula midline. Posterior oropharyngeal erythema present. No oropharyngeal exudate or uvula swelling.  ?   Tonsils: No  tonsillar abscesses.  ?Eyes:  ?   General: No scleral icterus.    ?   Right eye: No discharge.     ?   Left eye: No discharge.  ?   Conjunctiva/sclera: Conjunctivae normal.  ?Neck:  ?   Trachea: No tracheal deviation.  ?Cardiovascular:  ?   Rate and Rhythm: Normal rate and regular rhythm.  ?   Pulses: Normal pulses.  ?   Heart sounds: Normal heart sounds.  ?Pulmonary:  ?   Effort: Pulmonary effort is normal. No respiratory distress.  ?   Breath sounds: No stridor. Wheezing present. No rhonchi or rales.  ?Abdominal:  ?   General: Bowel sounds are normal. There is no distension.  ?   Palpations: Abdomen is soft.  ?Musculoskeletal:     ?   General: Normal range of motion.  ?  Cervical back: Normal range of motion and neck supple.  ?Skin: ?   General: Skin is warm and dry.  ?   Coloration: Skin is not pale.  ?   Findings: No rash.  ?Neurological:  ?   Mental Status: She is alert.  ?   Motor: No abnormal muscle tone.  ?   Coordination: Coordination normal.  ?Psychiatric:     ?   Behavior: Behavior normal.  ?  ? ?Results for orders placed or performed in visit on 05/03/21  ?CBC with Differential/Platelet  ?Result Value Ref Range  ? WBC 7.1 3.8 - 10.8 Thousand/uL  ? RBC 4.91 3.80 - 5.10 Million/uL  ? Hemoglobin 14.4 11.7 - 15.5 g/dL  ? HCT 44.2 35.0 - 45.0 %  ? MCV 90.0 80.0 - 100.0 fL  ? MCH 29.3 27.0 - 33.0 pg  ? MCHC 32.6 32.0 - 36.0 g/dL  ? RDW 12.3 11.0 - 15.0 %  ? Platelets 296 140 - 400 Thousand/uL  ? MPV 10.2 7.5 - 12.5 fL  ? Neutro Abs 4,984 1,500 - 7,800 cells/uL  ? Lymphs Abs 1,548 850 - 3,900 cells/uL  ? Absolute Monocytes 412 200 - 950 cells/uL  ? Eosinophils Absolute 114 15 - 500 cells/uL  ? Basophils Absolute 43 0 - 200 cells/uL  ? Neutrophils Relative % 70.2 %  ? Total Lymphocyte 21.8 %  ? Monocytes Relative 5.8 %  ? Eosinophils Relative 1.6 %  ? Basophils Relative 0.6 %  ?COMPLETE METABOLIC PANEL WITH GFR  ?Result Value Ref Range  ? Glucose, Bld 90 65 - 99 mg/dL  ? BUN 9 7 - 25 mg/dL  ? Creat 0.75 0.50 -  0.97 mg/dL  ? eGFR 105 > OR = 60 mL/min/1.55m  ? BUN/Creatinine Ratio NOT APPLICABLE 6 - 22 (calc)  ? Sodium 138 135 - 146 mmol/L  ? Potassium 4.2 3.5 - 5.3 mmol/L  ? Chloride 103 98 - 110 mmol/L  ? CO2 33 (H) 20

## 2021-08-23 NOTE — Patient Instructions (Signed)
Continue nose sprays, add antihistamine and try sudafed ?I recommend starting prednisone and use inhaler as needed for wheeze or coughing fits ?If you have worsening of symptoms or new fever I would recommend stopping the sudafed and starting the doxycycline to treat sinus infection. ? ? ?

## 2021-08-30 DIAGNOSIS — R109 Unspecified abdominal pain: Secondary | ICD-10-CM | POA: Diagnosis not present

## 2021-08-30 DIAGNOSIS — R1033 Periumbilical pain: Secondary | ICD-10-CM | POA: Diagnosis not present

## 2021-08-30 DIAGNOSIS — R935 Abnormal findings on diagnostic imaging of other abdominal regions, including retroperitoneum: Secondary | ICD-10-CM | POA: Diagnosis not present

## 2021-08-30 DIAGNOSIS — R112 Nausea with vomiting, unspecified: Secondary | ICD-10-CM | POA: Diagnosis not present

## 2021-08-30 DIAGNOSIS — Z882 Allergy status to sulfonamides status: Secondary | ICD-10-CM | POA: Diagnosis not present

## 2021-08-30 DIAGNOSIS — F17210 Nicotine dependence, cigarettes, uncomplicated: Secondary | ICD-10-CM | POA: Diagnosis not present

## 2021-08-30 DIAGNOSIS — R1031 Right lower quadrant pain: Secondary | ICD-10-CM | POA: Diagnosis not present

## 2021-08-30 DIAGNOSIS — Z880 Allergy status to penicillin: Secondary | ICD-10-CM | POA: Diagnosis not present

## 2021-08-30 DIAGNOSIS — F419 Anxiety disorder, unspecified: Secondary | ICD-10-CM | POA: Diagnosis not present

## 2021-08-30 DIAGNOSIS — K529 Noninfective gastroenteritis and colitis, unspecified: Secondary | ICD-10-CM | POA: Diagnosis not present

## 2021-08-31 DIAGNOSIS — R935 Abnormal findings on diagnostic imaging of other abdominal regions, including retroperitoneum: Secondary | ICD-10-CM | POA: Diagnosis not present

## 2021-08-31 DIAGNOSIS — R1033 Periumbilical pain: Secondary | ICD-10-CM | POA: Diagnosis not present

## 2021-09-02 ENCOUNTER — Other Ambulatory Visit: Payer: Self-pay | Admitting: Nurse Practitioner

## 2021-09-02 DIAGNOSIS — F419 Anxiety disorder, unspecified: Secondary | ICD-10-CM

## 2021-09-02 DIAGNOSIS — F331 Major depressive disorder, recurrent, moderate: Secondary | ICD-10-CM

## 2021-09-06 NOTE — Telephone Encounter (Signed)
Requested Prescriptions  Pending Prescriptions Disp Refills  . sertraline (ZOLOFT) 25 MG tablet [Pharmacy Med Name: SERTRALINE HCL 25 MG TABLET] 90 tablet 0    Sig: TAKE 1 TABLET (25 MG TOTAL) BY MOUTH DAILY.     Psychiatry:  Antidepressants - SSRI - sertraline Passed - 09/02/2021 12:34 PM      Passed - AST in normal range and within 360 days    AST  Date Value Ref Range Status  05/03/2021 17 10 - 30 U/L Final   SGOT(AST)  Date Value Ref Range Status  01/09/2014 20 15 - 37 Unit/L Final         Passed - ALT in normal range and within 360 days    ALT  Date Value Ref Range Status  05/03/2021 13 6 - 29 U/L Final   SGPT (ALT)  Date Value Ref Range Status  01/09/2014 36 U/L Final    Comment:    14-63 NOTE: New Reference Range 10/28/13          Passed - Completed PHQ-2 or PHQ-9 in the last 360 days      Passed - Valid encounter within last 6 months    Recent Outpatient Visits          2 weeks ago Acute non-recurrent pansinusitis   [facility] Delsa Grana, PA-C   1 month ago Moderate episode of recurrent major depressive disorder [facility])   [facility] Serafina Royals F, FNP   2 months ago Moderate episode of recurrent major depressive disorder [facility])   [facility] Bo Merino, FNP   3 months ago Severe depression [facility])   [facility] Bo Merino, FNP   3 months ago Severe depression [facility])   [facility] Kathrine Haddock, NP      Future Appointments            In 1 week Delsa Grana, PA-C [facility], [facility]

## 2021-09-19 ENCOUNTER — Ambulatory Visit: Payer: BC Managed Care – PPO | Admitting: Family Medicine

## 2021-09-19 DIAGNOSIS — F419 Anxiety disorder, unspecified: Secondary | ICD-10-CM

## 2021-09-19 DIAGNOSIS — F331 Major depressive disorder, recurrent, moderate: Secondary | ICD-10-CM

## 2021-12-07 ENCOUNTER — Other Ambulatory Visit: Payer: Self-pay

## 2021-12-07 ENCOUNTER — Ambulatory Visit: Payer: BC Managed Care – PPO | Admitting: Nurse Practitioner

## 2021-12-07 VITALS — BP 120/70 | HR 75 | Temp 98.8°F | Resp 18 | Ht 65.0 in | Wt 126.9 lb

## 2021-12-07 DIAGNOSIS — F419 Anxiety disorder, unspecified: Secondary | ICD-10-CM

## 2021-12-07 DIAGNOSIS — F331 Major depressive disorder, recurrent, moderate: Secondary | ICD-10-CM | POA: Insufficient documentation

## 2021-12-07 DIAGNOSIS — K219 Gastro-esophageal reflux disease without esophagitis: Secondary | ICD-10-CM

## 2021-12-07 MED ORDER — ESCITALOPRAM OXALATE 10 MG PO TABS
10.0000 mg | ORAL_TABLET | Freq: Every day | ORAL | 0 refills | Status: DC
Start: 2021-12-07 — End: 2021-12-26

## 2021-12-07 MED ORDER — OMEPRAZOLE 20 MG PO CPDR: 20.0000 mg | DELAYED_RELEASE_CAPSULE | Freq: Every day | ORAL | 0 refills | Status: DC

## 2021-12-07 NOTE — Assessment & Plan Note (Signed)
Discussed GeneSight testing and also provided information for local counselors.  We will start patient on Lexapro 10 mg daily.  Follow-up in 4 weeks.

## 2021-12-07 NOTE — Assessment & Plan Note (Signed)
Sitting acid reflux.  Recommend starting omeprazole 20 mg daily.  Try and avoid foods high in acid.  Also do not lay down an hour after eating.

## 2021-12-07 NOTE — Progress Notes (Signed)
BP 120/70   Pulse 75   Temp 98.8 F (37.1 C) (Oral)   Resp 18   Ht '5\' 5"'  (1.651 m)   Wt 126 lb 14.4 oz (57.6 kg)   SpO2 99%   BMI 21.12 kg/m    Subjective:    Patient ID: Leslie [name], female    DOB: 12/23/1985, 36 y.o.   MRN: [id number]  HPI: Leslie [name] is a 36 y.o. female  Chief Complaint  Patient presents with   Depression   Anxiety   Depression/anxiety: Patient has tried Celexa, hydroxyzine, vraylar, Buspar, Wellbutrin and most recently Zoloft.  She is currently taking Zoloft 25 mg daily. She says it has not helped at all.  Her PHQ9 and GAD scores are higher than last time. Discussed options with patient. Discussed Genesight testing.  She is going to think about it.  Will start Lexapro. Also provided information regarding counselors in the area.       12/07/2021    1:13 PM 08/18/2021    4:07 PM 08/05/2021   10:39 AM 07/06/2021    9:54 AM 06/08/2021    1:52 PM  Depression screen PHQ 2/9  Decreased Interest '2 2 1 2 1  ' Down, Depressed, Hopeless '3 2 2 2 ' 0  PHQ - 2 Score '5 4 3 4 1  ' Altered sleeping '3 1 1 2 1  ' Tired, decreased energy '3 2 1 3 2  ' Change in appetite '3 1 2 2 1  ' Feeling bad or failure about yourself  '2 1 1 1 ' 0  Trouble concentrating 2 1 0 2 0  Moving slowly or fidgety/restless 0 0 0 0 0  Suicidal thoughts 0 0 0 0 0  PHQ-9 Score '18 10 8 14 5  ' Difficult doing work/chores Very difficult Somewhat difficult Somewhat difficult Somewhat difficult Somewhat difficult       12/07/2021    1:14 PM 08/18/2021    4:07 PM 08/05/2021   10:40 AM 07/06/2021    9:54 AM  GAD 7 : Generalized Anxiety Score  Nervous, Anxious, on Edge '3 2 2 2  ' Control/stop worrying 2 0 1 1  Worry too much - different things 1 0 0 1  Trouble relaxing '3 2 1 2  ' Restless 3 2 0 1  Easily annoyed or irritable '3 2 2 2  ' Afraid - awful might happen 2 0 0 0  Total GAD 7 Score '17 8 6 9  ' Anxiety Difficulty Somewhat difficult Somewhat difficult Somewhat difficult Somewhat difficult   Acid reflux:  patient reports she has had worsening acid reflux.  Will start patient on omeprazole and see if that helps.  Try and avoid acidic foods.  Also do not lay down for an hour after eating.    Relevant past medical, surgical, family and social history reviewed and updated as indicated. Interim medical history since our last visit reviewed. Allergies and medications reviewed and updated.  Review of Systems  Constitutional: Negative for fever or weight change.  Respiratory: Negative for cough and shortness of breath.   Cardiovascular: Negative for chest pain or palpitations.  Gastrointestinal: Negative for abdominal pain, no bowel changes.  Musculoskeletal: Negative for gait problem or joint swelling.  Skin: Negative for rash.  Neurological: Negative for dizziness or headache.  No other specific complaints in a complete review of systems (except as listed in HPI above).      Objective:    BP 120/70   Pulse 75   Temp 98.8 F (37.1 C) (  Oral)   Resp 18   Ht '5\' 5"'  (1.651 m)   Wt 126 lb 14.4 oz (57.6 kg)   SpO2 99%   BMI 21.12 kg/m   Wt Readings from Last 3 Encounters:  12/07/21 126 lb 14.4 oz (57.6 kg)  08/18/21 124 lb 1.6 oz (56.3 kg)  08/05/21 125 lb 9.6 oz (57 kg)    Physical Exam  Constitutional: Patient appears well-developed and well-nourished.  No distress.  HEENT: head atraumatic, normocephalic, pupils equal and reactive to light, neck supple Cardiovascular: Normal rate, regular rhythm and normal heart sounds.  No murmur heard. No BLE edema. Pulmonary/Chest: Effort normal and breath sounds normal. No respiratory distress. Abdominal: Soft.  There is no tenderness. Psychiatric: Patient has a normal mood and affect. behavior is normal. Judgment and thought content normal.  Results for orders placed or performed in visit on 05/03/21  CBC with Differential/Platelet  Result Value Ref Range   WBC 7.1 3.8 - 10.8 Thousand/uL   RBC 4.91 3.80 - 5.10 Million/uL   Hemoglobin 14.4 11.7  - 15.5 g/dL   HCT 44.2 35.0 - 45.0 %   MCV 90.0 80.0 - 100.0 fL   MCH 29.3 27.0 - 33.0 pg   MCHC 32.6 32.0 - 36.0 g/dL   RDW 12.3 11.0 - 15.0 %   Platelets 296 140 - 400 Thousand/uL   MPV 10.2 7.5 - 12.5 fL   Neutro Abs 4,984 1,500 - 7,800 cells/uL   Lymphs Abs 1,548 850 - 3,900 cells/uL   Absolute Monocytes 412 200 - 950 cells/uL   Eosinophils Absolute 114 15 - 500 cells/uL   Basophils Absolute 43 0 - 200 cells/uL   Neutrophils Relative % 70.2 %   Total Lymphocyte 21.8 %   Monocytes Relative 5.8 %   Eosinophils Relative 1.6 %   Basophils Relative 0.6 %  COMPLETE METABOLIC PANEL WITH GFR  Result Value Ref Range   Glucose, Bld 90 65 - 99 mg/dL   BUN 9 7 - 25 mg/dL   Creat 0.75 0.50 - 0.97 mg/dL   eGFR 105 > OR = 60 mL/min/1.32m   BUN/Creatinine Ratio NOT APPLICABLE 6 - 22 (calc)   Sodium 138 135 - 146 mmol/L   Potassium 4.2 3.5 - 5.3 mmol/L   Chloride 103 98 - 110 mmol/L   CO2 33 (H) 20 - 32 mmol/L   Calcium 10.1 8.6 - 10.2 mg/dL   Total Protein 7.1 6.1 - 8.1 g/dL   Albumin 4.9 3.6 - 5.1 g/dL   Globulin 2.2 1.9 - 3.7 g/dL (calc)   AG Ratio 2.2 1.0 - 2.5 (calc)   Total Bilirubin 0.6 0.2 - 1.2 mg/dL   Alkaline phosphatase (APISO) 46 31 - 125 U/L   AST 17 10 - 30 U/L   ALT 13 6 - 29 U/L  TSH  Result Value Ref Range   TSH 2.44 mIU/L  Lipid panel  Result Value Ref Range   Cholesterol 176 <200 mg/dL   HDL 58 > OR = 50 mg/dL   Triglycerides 114 <150 mg/dL   LDL Cholesterol (Calc) 97 mg/dL (calc)   Total CHOL/HDL Ratio 3.0 <5.0 (calc)   Non-HDL Cholesterol (Calc) 118 <130 mg/dL (calc)      Assessment & Plan:   Problem List Items Addressed This Visit       Digestive   Gastroesophageal reflux disease without esophagitis    Sitting acid reflux.  Recommend starting omeprazole 20 mg daily.  Try and avoid foods high in acid.  Also do not lay down an hour after eating.      Relevant Medications   omeprazole (PRILOSEC) 20 MG capsule     Other   Moderate episode of  recurrent major depressive disorder ([place]) - Primary    Discussed GeneSight testing and also provided information for local counselors.  We will start patient on Lexapro 10 mg daily.  Follow-up in 4 weeks.      Relevant Medications   escitalopram (LEXAPRO) 10 MG tablet   Anxiety    Discussed GeneSight testing and also provided information for local counselors.  We will start patient on Lexapro 10 mg daily.  Follow-up in 4 weeks.      Relevant Medications   escitalopram (LEXAPRO) 10 MG tablet     Follow up plan: Return in about 4 weeks (around 01/04/2022) for follow up.

## 2021-12-07 NOTE — Assessment & Plan Note (Signed)
Discussed GeneSight testing and also provided information for local counselors.  We will start patient on Lexapro 10 mg daily.  Follow-up in 4 weeks. 

## 2021-12-08 ENCOUNTER — Other Ambulatory Visit: Payer: Self-pay | Admitting: Nurse Practitioner

## 2021-12-08 DIAGNOSIS — F331 Major depressive disorder, recurrent, moderate: Secondary | ICD-10-CM

## 2021-12-08 DIAGNOSIS — F419 Anxiety disorder, unspecified: Secondary | ICD-10-CM

## 2021-12-08 NOTE — Telephone Encounter (Signed)
Requested medication (s) are due for refill today: no  Requested medication (s) are on the active medication list: no  Last refill:  12/07/21 discontinued  Future visit scheduled:yes  Notes to clinic: Unable to refill per protocol, Rx expired.Medication was discontinued 12/07/21 by PCP.        Requested Prescriptions  Pending Prescriptions Disp Refills   sertraline (ZOLOFT) 25 MG tablet [Pharmacy Med Name: SERTRALINE HCL 25 MG TABLET] 90 tablet 0    Sig: TAKE 1 TABLET (25 MG TOTAL) BY MOUTH DAILY.     Psychiatry:  Antidepressants - SSRI - sertraline Passed - 12/08/2021  2:40 AM      Passed - AST in normal range and within 360 days    AST  Date Value Ref Range Status  05/03/2021 17 10 - 30 U/L Final   SGOT(AST)  Date Value Ref Range Status  01/09/2014 20 15 - 37 Unit/L Final         Passed - ALT in normal range and within 360 days    ALT  Date Value Ref Range Status  05/03/2021 13 6 - 29 U/L Final   SGPT (ALT)  Date Value Ref Range Status  01/09/2014 36 U/L Final    Comment:    14-63 NOTE: New Reference Range 10/28/13          Passed - Completed PHQ-2 or PHQ-9 in the last 360 days      Passed - Valid encounter within last 6 months    Recent Outpatient Visits           Yesterday Moderate episode of recurrent major depressive disorder [facility])   [facility] Berniece Salines, FNP   3 months ago Acute non-recurrent pansinusitis   [facility] Danelle Berry, PA-C   4 months ago Moderate episode of recurrent major depressive disorder [facility])   [facility] Della Goo F, FNP   5 months ago Moderate episode of recurrent major depressive disorder [facility])   [facility] Berniece Salines, FNP   6 months ago Severe depression [facility])   [facility] Berniece Salines, FNP       Future Appointments             In 2 weeks Danelle Berry, PA-C [facility]   In 3  weeks Zane Herald, Rudolpho Sevin, FNP [facility], [facility]

## 2021-12-26 ENCOUNTER — Ambulatory Visit (INDEPENDENT_AMBULATORY_CARE_PROVIDER_SITE_OTHER): Payer: BC Managed Care – PPO | Admitting: Family Medicine

## 2021-12-26 ENCOUNTER — Encounter: Payer: Self-pay | Admitting: Family Medicine

## 2021-12-26 VITALS — BP 118/80 | HR 74 | Temp 98.0°F | Resp 16 | Ht 65.0 in | Wt 129.9 lb

## 2021-12-26 DIAGNOSIS — F331 Major depressive disorder, recurrent, moderate: Secondary | ICD-10-CM

## 2021-12-26 DIAGNOSIS — Z0000 Encounter for general adult medical examination without abnormal findings: Secondary | ICD-10-CM

## 2021-12-26 DIAGNOSIS — Z23 Encounter for immunization: Secondary | ICD-10-CM

## 2021-12-26 DIAGNOSIS — Z833 Family history of diabetes mellitus: Secondary | ICD-10-CM

## 2021-12-26 DIAGNOSIS — F419 Anxiety disorder, unspecified: Secondary | ICD-10-CM

## 2021-12-26 DIAGNOSIS — Z8349 Family history of other endocrine, nutritional and metabolic diseases: Secondary | ICD-10-CM

## 2021-12-26 DIAGNOSIS — E785 Hyperlipidemia, unspecified: Secondary | ICD-10-CM

## 2021-12-26 DIAGNOSIS — G4700 Insomnia, unspecified: Secondary | ICD-10-CM

## 2021-12-26 DIAGNOSIS — Z3009 Encounter for other general counseling and advice on contraception: Secondary | ICD-10-CM

## 2021-12-26 DIAGNOSIS — Z124 Encounter for screening for malignant neoplasm of cervix: Secondary | ICD-10-CM | POA: Diagnosis not present

## 2021-12-26 MED ORDER — BUSPIRONE HCL 5 MG PO TABS: 5.0000 mg | ORAL_TABLET | Freq: Two times a day (BID) | ORAL | 1 refills | Status: DC | PRN

## 2021-12-26 MED ORDER — ESCITALOPRAM OXALATE 10 MG PO TABS: 10.0000 mg | ORAL_TABLET | Freq: Every day | ORAL | 3 refills | Status: DC

## 2021-12-26 MED ORDER — HYDROXYZINE PAMOATE 25 MG PO CAPS: 25.0000 mg | ORAL_CAPSULE | Freq: Every evening | ORAL | 0 refills | Status: DC | PRN

## 2021-12-26 NOTE — Patient Instructions (Addendum)
Health Maintenance  Topic Date Due   Pap Smear  10/10/2023   Tetanus Vaccine  05/17/2027   Hepatitis C Screening: USPSTF Recommendation to screen - Ages 18-38 yo.  Completed   HIV Screening  Completed   HPV Vaccine  Aged Out   Pap Smear  Discontinued   Flu Shot  Discontinued   COVID-19 Vaccine  Discontinued   You last did you PAP with HPV in 2020 and both were negative.  You can repeat in 3 to 5 years when you have negative cotesting I adjusted the dates above, so you are latest due for follow-up cervical cancer screening by July 2025 You can come sooner if you wish to have it done.  Preventive Care 71-84 Years Old, Female Preventive care refers to lifestyle choices and visits with your health care provider that can promote health and wellness. Preventive care visits are also called wellness exams. What can I expect for my preventive care visit? Counseling During your preventive care visit, your health care provider may ask about your: Medical history, including: Past medical problems. Family medical history. Pregnancy history. Current health, including: Menstrual cycle. Method of birth control. Emotional well-being. Home life and relationship well-being. Sexual activity and sexual health. Lifestyle, including: Alcohol, nicotine or tobacco, and drug use. Access to firearms. Diet, exercise, and sleep habits. Work and work Statistician. Sunscreen use. Safety issues such as seatbelt and bike helmet use. Physical exam Your health care provider may check your: Height and weight. These may be used to calculate your BMI (body mass index). BMI is a measurement that tells if you are at a healthy weight. Waist circumference. This measures the distance around your waistline. This measurement also tells if you are at a healthy weight and may help predict your risk of certain diseases, such as type 2 diabetes and high blood pressure. Heart rate and blood pressure. Body temperature. Skin  for abnormal spots. What immunizations do I need?  Vaccines are usually given at various ages, according to a schedule. Your health care provider will recommend vaccines for you based on your age, medical history, and lifestyle or other factors, such as travel or where you work. What tests do I need? Screening Your health care provider may recommend screening tests for certain conditions. This may include: Pelvic exam and Pap test. Lipid and cholesterol levels. Diabetes screening. This is done by checking your blood sugar (glucose) after you have not eaten for a while (fasting). Hepatitis B test. Hepatitis C test. HIV (human immunodeficiency virus) test. STI (sexually transmitted infection) testing, if you are at risk. BRCA-related cancer screening. This may be done if you have a family history of breast, ovarian, tubal, or peritoneal cancers. Talk with your health care provider about your test results, treatment options, and if necessary, the need for more tests. Follow these instructions at home: Eating and drinking  Eat a healthy diet that includes fresh fruits and vegetables, whole grains, lean protein, and low-fat dairy products. Take vitamin and mineral supplements as recommended by your health care provider. Do not drink alcohol if: Your health care provider tells you not to drink. You are pregnant, may be pregnant, or are planning to become pregnant. If you drink alcohol: Limit how much you have to 0-1 drink a day. Know how much alcohol is in your drink. In the [place] one drink equals one 12 oz bottle of beer (355 mL), one 5 oz glass of wine (148 mL), or one 1 oz glass of hard liquor (  44 mL). Lifestyle Brush your teeth every morning and night with fluoride toothpaste. Floss one time each day. Exercise for at least 30 minutes 5 or more days each week. Do not use any products that contain nicotine or tobacco. These products include cigarettes, chewing tobacco, and vaping devices,  such as e-cigarettes. If you need help quitting, ask your health care provider. Do not use drugs. If you are sexually active, practice safe sex. Use a condom or other form of protection to prevent STIs. If you do not wish to become pregnant, use a form of birth control. If you plan to become pregnant, see your health care provider for a prepregnancy visit. Find healthy ways to manage stress, such as: Meditation, yoga, or listening to music. Journaling. Talking to a trusted person. Spending time with friends and family. Minimize exposure to UV radiation to reduce your risk of skin cancer. Safety Always wear your seat belt while driving or riding in a vehicle. Do not drive: If you have been drinking alcohol. Do not ride with someone who has been drinking. If you have been using any mind-altering substances or drugs. While texting. When you are tired or distracted. Wear a helmet and other protective equipment during sports activities. If you have firearms in your house, make sure you follow all gun safety procedures. Seek help if you have been physically or sexually abused. What's next? Go to your health care provider once a year for an annual wellness visit. Ask your health care provider how often you should have your eyes and teeth checked. Stay up to date on all vaccines. This information is not intended to replace advice given to you by your health care provider. Make sure you discuss any questions you have with your health care provider. Document Revised: 09/22/2020 Document Reviewed: 09/22/2020 Elsevier Patient Education  [facility].

## 2021-12-26 NOTE — Progress Notes (Signed)
Patient: Leslie [name], Female    DOB: [birth year], 36 y.o.   MRN: [id number] Bo Merino, FNP Visit Date: 12/26/2021  Today's Provider: Delsa Grana, PA-C   Chief Complaint  Patient presents with   Annual Exam   Subjective:   Annual physical exam:  Leslie [name] is a 36 y.o. female who presents today for complete physical exam:  Exercise/Activity:  exercising  Diet/nutrition:  eats healthy Sleep:  ok - sleep is a little better, but still working on it with moods/anxiety/depression   [place]: No Food Insecurity (12/26/2021)  Housing: Low Risk  (12/26/2021)  Transportation Needs: No Transportation Needs (12/26/2021)  Utilities: Not At Risk (12/26/2021)  Alcohol Screen: Low Risk  (12/26/2021)  Depression (PHQ2-9): Low Risk  (12/26/2021)  Recent Concern: Depression (PHQ2-9) - High Risk (12/07/2021)  Financial Resource Strain: Low Risk  (12/26/2021)  Physical Activity: Sufficiently Active (12/26/2021)  Social Connections: Moderately Integrated (12/26/2021)  Stress: No Stress Concern Present (12/26/2021)  Tobacco Use: Medium Risk (12/26/2021)    USPSTF grade A and B recommendations - reviewed and addressed today  Depression:  Phq 9 completed today by patient, was reviewed by me with patient in the room PHQ score is neg, pt feels mood better on lexapro, things getting better less anxiety and depression sx    12/26/2021   10:12 AM 12/07/2021    1:13 PM 08/18/2021    4:07 PM 08/05/2021   10:39 AM  PHQ 2/9 Scores  PHQ - 2 Score '1 5 4 3  ' PHQ- 9 Score '1 18 10 8      ' 12/26/2021   10:12 AM 12/07/2021    1:13 PM 08/18/2021    4:07 PM 08/05/2021   10:39 AM 07/06/2021    9:54 AM  Depression screen PHQ 2/9  Decreased Interest 0 '2 2 1 2  ' Down, Depressed, Hopeless '1 3 2 2 2  ' PHQ - 2 Score '1 5 4 3 4  ' Altered sleeping 0 '3 1 1 2  ' Tired, decreased energy 0 '3 2 1 3  ' Change in appetite 0 '3 1 2 2  ' Feeling bad or failure about yourself  0 '2 1 1 1  ' Trouble  concentrating 0 2 1 0 2  Moving slowly or fidgety/restless 0 0 0 0 0  Suicidal thoughts 0 0 0 0 0  PHQ-9 Score '1 18 10 8 14  ' Difficult doing work/chores Somewhat difficult Very difficult Somewhat difficult Somewhat difficult Somewhat difficult    Alcohol screening: [place] Office Visit from 12/26/2021 in [facility]  AUDIT-C Score 6       Immunizations and Health Maintenance: Health Maintenance  Topic Date Due   PAP SMEAR-Modifier  10/10/2023   TETANUS/TDAP  05/17/2027   Hepatitis C Screening  Completed   HIV Screening  Completed   HPV VACCINES  Aged Out   PAP-Cervical Cytology Screening  Discontinued   INFLUENZA VACCINE  Discontinued   COVID-19 Vaccine  Discontinued     Hep C Screening: done  STD testing and prevention (HIV/chl/gon/syphilis):  see above, no additional testing desired by pt today   Intimate partner violence:  feels safe  Sexual History/Pain during Intercourse: Married,   Menstrual History/LMP/Abnormal Bleeding: with IUD 7 year ago, occasional spotting- Mirena placed after having kid in 2016 (expired?) Patient's last menstrual period was 11/22/2021 (approximate).  Incontinence Symptoms: none  Breast cancer: not due for screening  Last Mammogram: *see HM list above BRCA gene screening:  n/a  Cervical cancer screening: reviewed 2020 - PAP and HPV neg - discussed 3-5 year cotesting screening options - she would not like to do today Pt denies family hx of cancers - breast, ovarian, uterine, colon:     Osteoporosis:  n/a per age Discussion on osteoporosis per age, including high calcium and vitamin D supplementation, weight bearing exercises   Skin cancer:  Hx of skin CA -  NO Discussed atypical lesions   Colorectal cancer:   Colonoscopy is not due per age   Discussed concerning signs and sx of CRC, pt denies concerning sx  Lung cancer:   Low Dose CT Chest recommended if Age 18-80 years, 20 pack-year currently smoking OR  have quit w/in 15years. Patient does not qualify.    Social History   Tobacco Use   Smoking status: Former    Packs/day: 0.33    Years: 15.00    Total pack years: 4.95    Types: Cigarettes    Quit date: 05/11/2020    Years since quitting: 1.6   Smokeless tobacco: Never   Tobacco comments:    15 years smoking hx 1/3 ppd  Vaping Use   Vaping Use: Never used  Substance Use Topics   Alcohol use: Yes    Alcohol/week: 7.0 standard drinks of alcohol    Types: 7 Glasses of wine per week    Comment: 1 glass of wine   Drug use: No    Comment: pt denies during phone interview but UDS was + for marijuana in 2016     [place] Office Visit from 12/26/2021 in [facility]  AUDIT-C Score 6       Family History  Problem Relation Age of Onset   Diabetes Mother    Thyroid disease Maternal Aunt    Ovarian cancer Maternal Grandmother    Heart attack Maternal Grandmother    Cervical cancer Maternal Grandmother    Heart attack Maternal Grandfather    Emphysema Paternal Grandmother      Blood pressure/Hypertension: BP Readings from Last 3 Encounters:  12/26/21 118/80  12/07/21 120/70  08/18/21 114/72    Weight/Obesity: Wt Readings from Last 3 Encounters:  12/26/21 129 lb 14.4 oz (58.9 kg)  12/07/21 126 lb 14.4 oz (57.6 kg)  08/18/21 124 lb 1.6 oz (56.3 kg)   BMI Readings from Last 3 Encounters:  12/26/21 21.62 kg/m  12/07/21 21.12 kg/m  08/18/21 20.65 kg/m     Lipids:  Lab Results  Component Value Date   CHOL 176 05/03/2021   CHOL 182 12/23/2020   CHOL 205 (H) 12/23/2019   Lab Results  Component Value Date   HDL 58 05/03/2021   HDL 74 12/23/2020   HDL 64 12/23/2019   Lab Results  Component Value Date   LDLCALC 97 05/03/2021   LDLCALC 91 12/23/2020   LDLCALC 124 (H) 12/23/2019   Lab Results  Component Value Date   TRIG 114 05/03/2021   TRIG 76 12/23/2020   TRIG 78 12/23/2019   Lab Results  Component Value Date   CHOLHDL 3.0  05/03/2021   CHOLHDL 2.5 12/23/2020   CHOLHDL 3.2 12/23/2019   No results found for: "LDLDIRECT" Based on the results of lipid panel his/her cardiovascular risk factor ( using [facility] )  in the next 10 years is: The ASCVD Risk score (Arnett DK, et al., 2019) failed to calculate for the following reasons:   The 2019 ASCVD risk score is only valid for ages 34 to  79  Glucose:  Glucose  Date Value Ref Range Status  01/09/2014 81 65 - 99 mg/dL Final  05/07/2013 93 65 - 99 mg/dL Final  04/11/2013 74 65 - 99 mg/dL Final   Glucose, Bld  Date Value Ref Range Status  05/03/2021 90 65 - 99 mg/dL Final    Comment:    .            Fasting reference interval .   04/12/2021 73 65 - 99 mg/dL Final    Comment:    .            Fasting reference interval .   04/09/2021 110 (H) 70 - 99 mg/dL Final    Comment:    Glucose reference range applies only to samples taken after fasting for at least 8 hours.    Advanced Care Planning:  A voluntary discussion about advance care planning including the explanation and discussion of advance directives.   Discussed health care proxy and Living will, and the patient was able to identify a health care proxy as Sonia Side.   Patient does not have a living will at present time.   Social History       Social History   Socioeconomic History   Marital status: Married    Spouse name: Sonia Side   Number of children: 2   Years of education: 11   Highest education level: 11th grade  Occupational History   Not on file  Tobacco Use   Smoking status: Former    Packs/day: 0.33    Years: 15.00    Total pack years: 4.95    Types: Cigarettes    Quit date: 05/11/2020    Years since quitting: 1.6   Smokeless tobacco: Never   Tobacco comments:    15 years smoking hx 1/3 ppd  Vaping Use   Vaping Use: Never used  Substance and Sexual Activity   Alcohol use: Yes    Alcohol/week: 7.0 standard drinks of alcohol    Types: 7 Glasses of wine per week    Comment: 1  glass of wine   Drug use: No    Comment: pt denies during phone interview but UDS was + for marijuana in 2016   Sexual activity: Yes    Birth control/protection: I.U.D.  Other Topics Concern   Not on file  Social History Narrative   Not on file   Social Determinants of Health   Financial Resource Strain: Low Risk  (12/26/2021)   Overall Financial Resource Strain (CARDIA)    Difficulty of Paying Living Expenses: Not hard at all  Food Insecurity: No Food Insecurity (12/26/2021)   Hunger Vital Sign    Worried About Running Out of Food in the Last Year: Never true    Ran Out of Food in the Last Year: Never true  Transportation Needs: No Transportation Needs (12/26/2021)   PRAPARE - Hydrologist (Medical): No    Lack of Transportation (Non-Medical): No  Physical Activity: Sufficiently Active (12/26/2021)   Exercise Vital Sign    Days of Exercise per Week: 6 days    Minutes of Exercise per Session: 80 min  Stress: No Stress Concern Present (12/26/2021)   [place]    Feeling of Stress : Only a little  Social Connections: Moderately Integrated (12/26/2021)   Social Connection and Isolation Panel [NHANES]    Frequency of Communication with Friends and Family: More than three times a week  Frequency of Social Gatherings with Friends and Family: More than three times a week    Attends Religious Services: 1 to 4 times per year    Active Member of [employer] or Organizations: No    Attends Music therapist: Never    Marital Status: Married    Family History        Family History  Problem Relation Age of Onset   Diabetes Mother    Thyroid disease Maternal Aunt    Ovarian cancer Maternal Grandmother    Heart attack Maternal Grandmother    Cervical cancer Maternal Grandmother    Heart attack Maternal Grandfather    Emphysema Paternal Grandmother     Patient Active Problem List    Diagnosis Date Noted   Moderate episode of recurrent major depressive disorder ([place]) 12/07/2021   Anxiety 12/07/2021   Insomnia due to other mental disorder 07/06/2021   Severe depression ([place]) 05/17/2021   Panic anxiety syndrome 05/03/2021   Hyperlipidemia 12/23/2019   Family history of thyroid disease 12/23/2019   Family history of diabetes mellitus in mother 12/23/2019   Gastroesophageal reflux disease without esophagitis 09/09/2018   Lumbosacral radiculopathy 01/07/2015    Past Surgical History:  Procedure Laterality Date   BREAST SURGERY     augmentation   CESAREAN SECTION  2005   CESAREAN SECTION  2016   CESAREAN SECTION  2005, 2016   CHOLECYSTECTOMY N/A 02/14/2016   Procedure: LAPAROSCOPIC CHOLECYSTECTOMY WITH INTRAOPERATIVE CHOLANGIOGRAM;  Surgeon: Robert Bellow, MD;  Location: [facility] ORS;  Service: General;  Laterality: N/A;   [place]     Current Outpatient Medications:    albuterol (VENTOLIN HFA) 108 (90 Base) MCG/ACT inhaler, Inhale 2 puffs into the lungs every 4 (four) hours as needed for wheezing or shortness of breath., Disp: 18 g, Rfl: 0   busPIRone (BUSPAR) 5 MG tablet, Take 1-2 tablets (5-10 mg total) by mouth 2 (two) times daily as needed (anxiety sx)., Disp: 30 tablet, Rfl: 1   fluticasone (FLONASE) 50 MCG/ACT nasal spray, SPRAY 2 SPRAYS INTO EACH NOSTRIL EVERY DAY, Disp: 48 mL, Rfl: 1   hydrOXYzine (VISTARIL) 25 MG capsule, Take 1 capsule (25 mg total) by mouth at bedtime as needed for anxiety (insomnia)., Disp: 30 capsule, Rfl: 0   levocetirizine (XYZAL) 5 MG tablet, Take 1 tablet (5 mg total) by mouth every evening., Disp: 90 tablet, Rfl: 1   levonorgestrel (MIRENA) 20 MCG/24HR IUD, 1 each by Intrauterine route once., Disp: , Rfl:    omeprazole (PRILOSEC) 20 MG capsule, Take 1 capsule (20 mg total) by mouth daily., Disp: 30 capsule, Rfl: 0   propranolol (INDERAL) 10 MG tablet, TAKE 1 TABLET  BY MOUTH THREE TIMES A DAY AS NEEDED, Disp: 90 tablet, Rfl: 0   escitalopram (LEXAPRO) 10 MG tablet, Take 1 tablet (10 mg total) by mouth daily., Disp: 90 tablet, Rfl: 3  Allergies  Allergen Reactions   Amoxicillin Hives    Has patient had a PCN reaction causing immediate rash, facial/tongue/throat swelling, SOB or lightheadedness with hypotension: {no Has patient had a PCN reaction causing severe rash involving mucus membranes or skin necrosis: no Has patient had a PCN reaction that required hospitalization no Has patient had a PCN reaction occurring within the last 10 years: no If all of the above answers are "NO", then may proceed with Cephalosporin use.   Bactrim [Sulfamethoxazole-Trimethoprim] Hives   Penicillins Hives    Has patient  had a PCN reaction causing immediate rash, facial/tongue/throat swelling, SOB or lightheadedness with hypotension: {no Has patient had a PCN reaction causing severe rash involving mucus membranes or skin necrosis: {no Has patient had a PCN reaction that required hospitalization no Has patient had a PCN reaction occurring within the last 10 years: {no If all of the above answers are "NO", then may proceed with Cephalosporin use.   Denture Adhesive Rash   Tape Rash    PT TOLERATES PAPER TAPE WELL    Patient Care Team: Bo Merino, FNP as PCP - General (Nurse Practitioner) Nathaneil Canary, PA-C (Physician Assistant) Tommy Medal, MD as Referring Physician (Family Medicine) Bary Castilla, Forest Gleason, MD (General Surgery) Polanco, Shari Heritage, MD as Referring Physician (Family Medicine) Bary Castilla Forest Gleason, MD (General Surgery)   Chart Review: I personally reviewed active problem list, medication list, allergies, family history, social history, health maintenance, notes from last encounter, lab results, imaging with the patient/caregiver today.   Review of Systems  Constitutional: Negative.   HENT: Negative.    Eyes: Negative.   Respiratory:  Negative.    Cardiovascular: Negative.   Gastrointestinal: Negative.   Endocrine: Negative.   Genitourinary: Negative.   Musculoskeletal: Negative.   Skin: Negative.   Allergic/Immunologic: Negative.   Neurological: Negative.   Hematological: Negative.   Psychiatric/Behavioral: Negative.    All other systems reviewed and are negative.         Objective:   Vitals:  Vitals:   12/26/21 1016  BP: 118/80  Pulse: 74  Resp: 16  Temp: 98 F (36.7 C)  TempSrc: Oral  SpO2: 96%  Weight: 129 lb 14.4 oz (58.9 kg)  Height: '5\' 5"'  (1.651 m)    Body mass index is 21.62 kg/m.  Physical Exam Constitutional:      General: She is not in acute distress.    Appearance: Normal appearance. She is well-developed and normal weight. She is not ill-appearing, toxic-appearing or diaphoretic.  HENT:     Head: Normocephalic and atraumatic.     Right Ear: Tympanic membrane, ear canal and external ear normal. There is no impacted cerumen.     Left Ear: Tympanic membrane, ear canal and external ear normal. There is no impacted cerumen.     Nose: Nose normal. No congestion.     Mouth/Throat:     Pharynx: Oropharynx is clear. Uvula midline. No oropharyngeal exudate or posterior oropharyngeal erythema.  Eyes:     General: Lids are normal.        Right eye: No discharge.        Left eye: No discharge.     Conjunctiva/sclera: Conjunctivae normal.  Neck:     Trachea: Phonation normal. No tracheal deviation.  Cardiovascular:     Rate and Rhythm: Normal rate and regular rhythm.     Pulses: Normal pulses.          Radial pulses are 2+ on the right side and 2+ on the left side.       Posterior tibial pulses are 2+ on the right side and 2+ on the left side.     Heart sounds: Normal heart sounds. No murmur heard.    No friction rub. No gallop.  Pulmonary:     Effort: Pulmonary effort is normal. No respiratory distress.     Breath sounds: Normal breath sounds. No stridor. No wheezing, rhonchi or rales.   Chest:     Chest wall: No tenderness.  Abdominal:     General:  Bowel sounds are normal. There is no distension.     Palpations: Abdomen is soft.  Musculoskeletal:        General: No deformity.     Cervical back: Normal range of motion and neck supple.     Right lower leg: No edema.     Left lower leg: No edema.  Lymphadenopathy:     Cervical: No cervical adenopathy.  Skin:    General: Skin is warm and dry.     Coloration: Skin is not pale.     Findings: No rash.     Comments: Bilateral great toenails with horizontal white lines, multiple  Neurological:     Mental Status: She is alert. Mental status is at baseline.     Motor: No abnormal muscle tone.     Gait: Gait normal.  Psychiatric:        Mood and Affect: Mood normal.        Speech: Speech normal.        Behavior: Behavior normal.       Fall Risk:    12/26/2021   10:12 AM 12/07/2021    1:11 PM 08/18/2021    4:02 PM 08/05/2021   10:38 AM 07/06/2021    9:53 AM  Fall Risk   Falls in the past year? 0 0 0 0 0  Number falls in past yr: 0 0  0 0  Injury with Fall? 0 0  0 0  Risk for fall due to : No Fall Risks  No Fall Risks    Follow up Falls prevention discussed;Education provided Falls evaluation completed Falls prevention discussed Falls evaluation completed Falls evaluation completed    Functional Status Survey: Is the patient deaf or have difficulty hearing?: No Does the patient have difficulty seeing, even when wearing glasses/contacts?: Yes Does the patient have difficulty concentrating, remembering, or making decisions?: No Does the patient have difficulty walking or climbing stairs?: No Does the patient have difficulty dressing or bathing?: No Does the patient have difficulty doing errands alone such as visiting a doctor's office or shopping?: No   Assessment & Plan:    CPE completed today  USPSTF grade A and B recommendations reviewed with patient; age-appropriate recommendations, preventive care,  screening tests, etc discussed and encouraged; healthy living encouraged; see AVS for patient education given to patient  Discussed importance of 150 minutes of physical activity weekly, AHA exercise recommendations given to pt in AVS/handout  Discussed importance of healthy diet:  eating lean meats and proteins, avoiding trans fats and saturated fats, avoid simple sugars and excessive carbs in diet, eat 6 servings of fruit/vegetables daily and drink plenty of water and avoid sweet beverages.    Recommended pt to do annual eye exam and routine dental exams/cleanings  Depression, alcohol, fall screening completed as documented above and per flowsheets  Advance Care planning information and packet discussed and offered today, encouraged pt to discuss with family members/spouse/partner/friends and complete Advanced directive packet and bring copy to office   Reviewed Health Maintenance: Health Maintenance  Topic Date Due   PAP SMEAR-Modifier  10/10/2023   TETANUS/TDAP  05/17/2027   Hepatitis C Screening  Completed   HIV Screening  Completed   HPV VACCINES  Aged Out   PAP-Cervical Cytology Screening  Discontinued   INFLUENZA VACCINE  Discontinued   COVID-19 Vaccine  Discontinued    Immunizations: Immunization History  Administered Date(s) Administered   Td 08/29/2009   Tdap 11/02/2015, 05/16/2017   Vaccines:  HPV: up to  at age 12 , ask insurance if age between 11-45  Shingrix: 36-64 yo and ask insurance if covered when patient above 74 yo Pneumonia: not indicated today educated and discussed with patient. Flu: declines- educated and discussed with patient. COVID:      ICD-10-CM   1. Annual physical exam  N63.94 COMPLETE METABOLIC PANEL WITH GFR    CBC with Differential/Platelet    Lipid panel    2. Screening for malignant neoplasm of cervix  Z12.4    will do 5 year cotesting     3. Need for influenza vaccination  Z23     4. Family history of thyroid disease  Z83.49 TSH     5. Hyperlipidemia, unspecified hyperlipidemia type  E78.5 Lipid panel    6. Family history of diabetes mellitus in mother  Z80.3     30. Moderate episode of recurrent major depressive disorder (HCC)  F33.1 escitalopram (LEXAPRO) 10 MG tablet    8. Anxiety  F41.9 escitalopram (LEXAPRO) 10 MG tablet   on lexapro, sx better controlled, refills entered, buspar and hydrox sent in as options    9. Insomnia, unspecified type  G47.00    trial of buspar or hydroxyzine for anxiety/sleep - she will let us know if either are helpful    10. Counseling for birth control regarding intrauterine device (IUD)  Z30.09 Ambulatory referral to Obstetrics / Gynecology   Pt Mirena expired - has been in since 2016- believe it lasts only 5 years, f/up with OBGYN for removal and insertion of new IUD, discussed other BC               Delsa Grana, PA-C 12/26/21 11:07 AM  [place]

## 2021-12-30 ENCOUNTER — Other Ambulatory Visit: Payer: Self-pay | Admitting: Nurse Practitioner

## 2021-12-30 DIAGNOSIS — K219 Gastro-esophageal reflux disease without esophagitis: Secondary | ICD-10-CM

## 2021-12-30 NOTE — Telephone Encounter (Signed)
Requested Prescriptions  Pending Prescriptions Disp Refills  . omeprazole (PRILOSEC) 20 MG capsule [Pharmacy Med Name: OMEPRAZOLE DR 20 MG CAPSULE] 30 capsule 0    Sig: TAKE 1 CAPSULE BY MOUTH EVERY DAY     Gastroenterology: Proton Pump Inhibitors Passed - 12/30/2021 10:35 AM      Passed - Valid encounter within last 12 months    Recent Outpatient Visits          4 days ago Annual physical exam   [facility] Delsa Grana, PA-C   3 weeks ago Moderate episode of recurrent major depressive disorder [facility])   [facility] Bo Merino, FNP   4 months ago Acute non-recurrent pansinusitis   [facility] Delsa Grana, PA-C   4 months ago Moderate episode of recurrent major depressive disorder [facility])   [facility] Serafina Royals F, FNP   5 months ago Moderate episode of recurrent major depressive disorder [facility])   [facility] Bo Merino, FNP      Future Appointments            In 5 days Reece Packer, Myna Hidalgo, [facility], [place]   In 12 months Reece Packer, Myna Hidalgo, [facility], [facility]

## 2022-01-04 ENCOUNTER — Telehealth (INDEPENDENT_AMBULATORY_CARE_PROVIDER_SITE_OTHER): Payer: BC Managed Care – PPO | Admitting: Nurse Practitioner

## 2022-01-04 DIAGNOSIS — F331 Major depressive disorder, recurrent, moderate: Secondary | ICD-10-CM | POA: Diagnosis not present

## 2022-01-04 DIAGNOSIS — F418 Other specified anxiety disorders: Secondary | ICD-10-CM | POA: Diagnosis not present

## 2022-01-04 DIAGNOSIS — F419 Anxiety disorder, unspecified: Secondary | ICD-10-CM

## 2022-01-04 MED ORDER — ESCITALOPRAM OXALATE 20 MG PO TABS: 20.0000 mg | ORAL_TABLET | Freq: Every day | ORAL | 0 refills | Status: DC

## 2022-01-04 NOTE — Assessment & Plan Note (Signed)
Depression and anxiety has improved but would like to increase lexapro to 20 mg daily. Will send in new dose. Follow up in 4 weeks

## 2022-01-04 NOTE — Assessment & Plan Note (Signed)
Depression and anxiety has improved but would like to increase lexapro to 20 mg daily. Will send in new dose. Follow up in 4 weeks 

## 2022-01-04 NOTE — Progress Notes (Signed)
Name: Leslie [name]   MRN: [id number]    DOB: 02/18/1986   Date:01/04/2022       Progress Note  Subjective  Chief Complaint  Chief Complaint  Patient presents with   Follow-up    Pt states has been doing a lot better but feels like needs an increase in her Lexapro medication   Anxiety   Depression    I connected with  Leslie [name]  on 01/04/22 at 10:30 am by a video enabled telemedicine application and verified that I am speaking with the correct person using two identifiers.  I discussed the limitations of evaluation and management by telemedicine and the availability of in person appointments. The patient expressed understanding and agreed to proceed with a virtual visit  Staff also discussed with the patient that there may be a patient responsible charge related to this service. Patient Location: Home Provider Location: [facility] Additional Individuals present: Alone  HPI  Anxiety/depression: She has been struggling with anxiety and depression for some time now.  At last visit with me on December 07, 2021 we started her on Lexapro 10 mg daily.  She had her physical with Danelle Berry PA and was prescribed hydroxyzine and buspar.  Patient reports that she had not tried the hydroxyzine or buspar yet. Patient reports she is doing much better.  But feels like an increase would be even more helpful.  Increase Lexapro to 20 mg daily. Patient PHQ9 and GAD scores have improved significantly.      01/04/2022   10:28 AM 12/26/2021   10:12 AM 12/07/2021    1:13 PM 08/18/2021    4:07 PM 08/05/2021   10:39 AM  Depression screen PHQ 2/9  Decreased Interest 0 0 2 2 1   Down, Depressed, Hopeless 0 1 3 2 2   PHQ - 2 Score 0 1 5 4 3   Altered sleeping 0 0 3 1 1   Tired, decreased energy 0 0 3 2 1   Change in appetite 0 0 3 1 2   Feeling bad or failure about yourself  0 0 2 1 1   Trouble concentrating 0 0 2 1 0  Moving slowly or fidgety/restless 0 0 0 0 0  Suicidal thoughts 0 0 0 0 0  PHQ-9 Score 0 1 18 10 8    Difficult doing work/chores Not difficult at all Somewhat difficult Very difficult Somewhat difficult Somewhat difficult       01/04/2022   10:28 AM 12/26/2021   10:13 AM 12/07/2021    1:14 PM 08/18/2021    4:07 PM  GAD 7 : Generalized Anxiety Score  Nervous, Anxious, on Edge 1 1 3 2   Control/stop worrying 0 0 2 0  Worry too much - different things 0 0 1 0  Trouble relaxing 0 0 3 2  Restless 0 0 3 2  Easily annoyed or irritable 0 0 3 2  Afraid - awful might happen 0 0 2 0  Total GAD 7 Score 1 1 17 8   Anxiety Difficulty Not difficult at all Somewhat difficult Somewhat difficult Somewhat difficult     Patient Active Problem List   Diagnosis Date Noted   Moderate episode of recurrent major depressive disorder (HCC) 12/07/2021   Anxiety 12/07/2021   Insomnia due to other mental disorder 07/06/2021   Severe depression (HCC) 05/17/2021   Panic anxiety syndrome 05/03/2021   Hyperlipidemia 12/23/2019   Family history of thyroid disease 12/23/2019   Family history of diabetes mellitus in mother 12/23/2019   Gastroesophageal  reflux disease without esophagitis 09/09/2018   Lumbosacral radiculopathy 01/07/2015    Social History   Tobacco Use   Smoking status: Former    Packs/day: 0.33    Years: 15.00    Total pack years: 4.95    Types: Cigarettes    Quit date: 05/11/2020    Years since quitting: 1.6   Smokeless tobacco: Never   Tobacco comments:    15 years smoking hx 1/3 ppd  Substance Use Topics   Alcohol use: Yes    Alcohol/week: 7.0 standard drinks of alcohol    Types: 7 Glasses of wine per week    Comment: 1 glass of wine     Current Outpatient Medications:    albuterol (VENTOLIN HFA) 108 (90 Base) MCG/ACT inhaler, Inhale 2 puffs into the lungs every 4 (four) hours as needed for wheezing or shortness of breath., Disp: 18 g, Rfl: 0   busPIRone (BUSPAR) 5 MG tablet, Take 1-2 tablets (5-10 mg total) by mouth 2 (two) times daily as needed (anxiety sx)., Disp: 30 tablet, Rfl:  1   fluticasone (FLONASE) 50 MCG/ACT nasal spray, SPRAY 2 SPRAYS INTO EACH NOSTRIL EVERY DAY, Disp: 48 mL, Rfl: 1   hydrOXYzine (VISTARIL) 25 MG capsule, Take 1 capsule (25 mg total) by mouth at bedtime as needed for anxiety (insomnia)., Disp: 30 capsule, Rfl: 0   levocetirizine (XYZAL) 5 MG tablet, Take 1 tablet (5 mg total) by mouth every evening., Disp: 90 tablet, Rfl: 1   levonorgestrel (MIRENA) 20 MCG/24HR IUD, 1 each by Intrauterine route once., Disp: , Rfl:    omeprazole (PRILOSEC) 20 MG capsule, TAKE 1 CAPSULE BY MOUTH EVERY DAY, Disp: 90 capsule, Rfl: 3   propranolol (INDERAL) 10 MG tablet, TAKE 1 TABLET BY MOUTH THREE TIMES A DAY AS NEEDED, Disp: 90 tablet, Rfl: 0  Allergies  Allergen Reactions   Amoxicillin Hives    Has patient had a PCN reaction causing immediate rash, facial/tongue/throat swelling, SOB or lightheadedness with hypotension: {no Has patient had a PCN reaction causing severe rash involving mucus membranes or skin necrosis: no Has patient had a PCN reaction that required hospitalization no Has patient had a PCN reaction occurring within the last 10 years: no If all of the above answers are "NO", then may proceed with Cephalosporin use.   Bactrim [Sulfamethoxazole-Trimethoprim] Hives   Penicillins Hives    Has patient had a PCN reaction causing immediate rash, facial/tongue/throat swelling, SOB or lightheadedness with hypotension: {no Has patient had a PCN reaction causing severe rash involving mucus membranes or skin necrosis: {no Has patient had a PCN reaction that required hospitalization no Has patient had a PCN reaction occurring within the last 10 years: {no If all of the above answers are "NO", then may proceed with Cephalosporin use.   Denture Adhesive Rash   Tape Rash    PT TOLERATES PAPER TAPE WELL    I personally reviewed active problem list, medication list, allergies, notes from last encounter with the patient/caregiver today.  ROS  Constitutional:  Negative for fever or weight change.  Respiratory: Negative for cough and shortness of breath.   Cardiovascular: Negative for chest pain or palpitations.  Gastrointestinal: Negative for abdominal pain, no bowel changes.  Musculoskeletal: Negative for gait problem or joint swelling.  Skin: Negative for rash.  Neurological: Negative for dizziness or headache.  No other specific complaints in a complete review of systems (except as listed in HPI above).   Objective  Virtual encounter, vitals not obtained.  There  is no height or weight on file to calculate BMI.  Nursing Note and Vital Signs reviewed.  Physical Exam  Awake, alert, oriented x3 speaking complete sentences.  No results found for this or any previous visit (from the past 72 hour(s)).  Assessment & Plan  Problem List Items Addressed This Visit       Other   Moderate episode of recurrent major depressive disorder ([place]) - Primary    Depression and anxiety has improved but would like to increase lexapro to 20 mg daily. Will send in new dose. Follow up in 4 weeks      Relevant Medications   escitalopram (LEXAPRO) 20 MG tablet   Anxiety    Depression and anxiety has improved but would like to increase lexapro to 20 mg daily. Will send in new dose. Follow up in 4 weeks      Relevant Medications   escitalopram (LEXAPRO) 20 MG tablet     -Red flags and when to present for emergency care or RTC including fever >101.4F, chest pain, shortness of breath, new/worsening/un-resolving symptoms,  reviewed with patient at time of visit. Follow up and care instructions discussed and provided in AVS. - I discussed the assessment and treatment plan with the patient. The patient was provided an opportunity to ask questions and all were answered. The patient agreed with the plan and demonstrated an understanding of the instructions.  I provided 15 minutes of non-face-to-face time during this encounter.  Bo Merino, FNP

## 2022-01-15 ENCOUNTER — Other Ambulatory Visit: Payer: Self-pay | Admitting: Family Medicine

## 2022-01-23 ENCOUNTER — Telehealth: Payer: Self-pay

## 2022-01-23 NOTE — Telephone Encounter (Signed)
Per [facility], called pt to let her know her IUD is good until 2024. She has an appt scheduled for tomorrow to have it replaced. She can keep tomorrows appt for replacement or wait another year. Pt did not answer phone call and could not leave a voice msg.

## 2022-01-24 ENCOUNTER — Encounter: Payer: BC Managed Care – PPO | Admitting: Obstetrics and Gynecology

## 2022-01-24 NOTE — Telephone Encounter (Signed)
Called pt to advise on IUD and she would like to wait until 2024. Found pt in [place] under [place], Mirena insertion date was 09/01/2014.

## 2022-01-30 ENCOUNTER — Other Ambulatory Visit: Payer: Self-pay | Admitting: Nurse Practitioner

## 2022-01-30 ENCOUNTER — Other Ambulatory Visit: Payer: Self-pay | Admitting: Family Medicine

## 2022-01-30 DIAGNOSIS — F331 Major depressive disorder, recurrent, moderate: Secondary | ICD-10-CM

## 2022-01-30 DIAGNOSIS — F419 Anxiety disorder, unspecified: Secondary | ICD-10-CM

## 2022-01-31 NOTE — Telephone Encounter (Signed)
Requested medication (s) are due for refill today: yes  Requested medication (s) are on the active medication list: yes  Last refill:  01/04/22  Future visit scheduled: yes  Notes to clinic:  Unable to refill per protocol, : REQUEST FOR 90 DAYS PRESCRIPTION. DX Code Needed.      Requested Prescriptions  Pending Prescriptions Disp Refills   escitalopram (LEXAPRO) 20 MG tablet [Pharmacy Med Name: ESCITALOPRAM 20 MG TABLET] 90 tablet 1    Sig: TAKE 1 TABLET BY MOUTH EVERY DAY     Psychiatry:  Antidepressants - SSRI Passed - 01/30/2022 12:31 PM      Passed - Completed PHQ-2 or PHQ-9 in the last 360 days      Passed - Valid encounter within last 6 months    Recent Outpatient Visits           3 weeks ago Moderate episode of recurrent major depressive disorder [facility])   [facility] Bo Merino, FNP   1 month ago Annual physical exam   [facility] Delsa Grana, PA-C   1 month ago Moderate episode of recurrent major depressive disorder [facility])   [facility] Bo Merino, FNP   5 months ago Acute non-recurrent pansinusitis   [facility] Delsa Grana, PA-C   5 months ago Moderate episode of recurrent major depressive disorder [facility])   [facility] Bo Merino, FNP       Future Appointments             In 11 months Reece Packer, Myna Hidalgo, [facility], [facility]

## 2022-02-16 ENCOUNTER — Other Ambulatory Visit: Payer: Self-pay | Admitting: Nurse Practitioner

## 2022-02-16 DIAGNOSIS — F331 Major depressive disorder, recurrent, moderate: Secondary | ICD-10-CM

## 2022-02-16 DIAGNOSIS — F419 Anxiety disorder, unspecified: Secondary | ICD-10-CM

## 2022-02-16 MED ORDER — ESCITALOPRAM OXALATE 20 MG PO TABS: 20.0000 mg | ORAL_TABLET | Freq: Every day | ORAL | 1 refills | Status: DC

## 2022-04-02 ENCOUNTER — Other Ambulatory Visit: Payer: Self-pay | Admitting: Family Medicine

## 2022-04-02 DIAGNOSIS — J0140 Acute pansinusitis, unspecified: Secondary | ICD-10-CM

## 2022-07-20 NOTE — Progress Notes (Signed)
BP 120/68   Pulse 82   Temp 98.5 F (36.9 C) (Oral)   Resp 18   Ht 5\' 5"  (1.651 m)   Wt 130 lb 8 oz (59.2 kg)   SpO2 98%   BMI 21.72 kg/m    Subjective:    Patient ID: Leslie [name], female    DOB: 1985/10/13, 37 y.o.   MRN: [id number]  HPI: Leslie [name] is a 37 y.o. female  Chief Complaint  Patient presents with   Gastroesophageal Reflux   GERD:currently taking omeprazole 20 mg daily.  She says that she has bad acid reflux maybe 4-5 times a week.  She says it is causing her pain.  She says that she is having decreased appetite because of the acid reflux.  She says the omeprazole is not helping.  She says that she will get a burning and vomiting feeling in the back of her throat.  She says that then she will have a sore throat. She says she also gets tender in her upper abdomen.  She says she will have diarrhea one day and constipation the next.   Will get h.pylori test.  Start pepcid 20 mg two times a day. If no improvement will refer to gi.    Anxiety/depression: she was prescribed  lexapro 20 mg daily, hydroxyzine 25 mg at bedtime and buspar 2 times a day as needed.   She is no longer taking lexapro.      07/21/2022    2:56 PM 01/04/2022   10:28 AM 12/26/2021   10:12 AM 12/07/2021    1:13 PM 08/18/2021    4:07 PM  Depression screen PHQ 2/9  Decreased Interest 0 0 0 2 2  Down, Depressed, Hopeless 0 0 1 3 2   PHQ - 2 Score 0 0 1 5 4   Altered sleeping 0 0 0 3 1  Tired, decreased energy 0 0 0 3 2  Change in appetite 0 0 0 3 1  Feeling bad or failure about yourself  0 0 0 2 1  Trouble concentrating 0 0 0 2 1  Moving slowly or fidgety/restless 0 0 0 0 0  Suicidal thoughts 0 0 0 0 0  PHQ-9 Score 0 0 1 18 10   Difficult doing work/chores Not difficult at all Not difficult at all Somewhat difficult Very difficult Somewhat difficult       07/21/2022    2:56 PM 01/04/2022   10:28 AM 12/26/2021   10:13 AM 12/07/2021    1:14 PM  GAD 7 : Generalized Anxiety Score  Nervous, Anxious,  on Edge 1 1 1 3   Control/stop worrying 1 0 0 2  Worry too much - different things 1 0 0 1  Trouble relaxing 1 0 0 3  Restless 0 0 0 3  Easily annoyed or irritable 0 0 0 3  Afraid - awful might happen 0 0 0 2  Total GAD 7 Score 4 1 1 17   Anxiety Difficulty Not difficult at all Not difficult at all Somewhat difficult Somewhat difficult     Relevant past medical, surgical, family and social history reviewed and updated as indicated. Interim medical history since our last visit reviewed. Allergies and medications reviewed and updated.  Review of Systems  Constitutional: Negative for fever or weight change.  Respiratory: Negative for cough and shortness of breath.   Cardiovascular: Negative for chest pain or palpitations.  Gastrointestinal:positive for abdominal pain, diarrhea/constipation Musculoskeletal: Negative for gait problem or joint swelling.  Skin:  Negative for rash.  Neurological: Negative for dizziness or headache.  No other specific complaints in a complete review of systems (except as listed in HPI above).      Objective:    BP 120/68   Pulse 82   Temp 98.5 F (36.9 C) (Oral)   Resp 18   Ht 5\' 5"  (1.651 m)   Wt 130 lb 8 oz (59.2 kg)   SpO2 98%   BMI 21.72 kg/m   Wt Readings from Last 3 Encounters:  07/21/22 130 lb 8 oz (59.2 kg)  12/26/21 129 lb 14.4 oz (58.9 kg)  12/07/21 126 lb 14.4 oz (57.6 kg)    Physical Exam  Constitutional: Patient appears well-developed and well-nourished. No distress.  HEENT: head atraumatic, normocephalic, pupils equal and reactive to light, neck supple Cardiovascular: Normal rate, regular rhythm and normal heart sounds.  No murmur heard. No BLE edema. Pulmonary/Chest: Effort normal and breath sounds normal. No respiratory distress. Abdominal: Soft.  There is no tenderness. Psychiatric: Patient has a normal mood and affect. behavior is normal. Judgment and thought content normal.  Results for orders placed or performed in visit on  05/03/21  CBC with Differential/Platelet  Result Value Ref Range   WBC 7.1 3.8 - 10.8 Thousand/uL   RBC 4.91 3.80 - 5.10 Million/uL   Hemoglobin 14.4 11.7 - 15.5 g/dL   HCT 16.144.2 09.635.0 - 04.545.0 %   MCV 90.0 80.0 - 100.0 fL   MCH 29.3 27.0 - 33.0 pg   MCHC 32.6 32.0 - 36.0 g/dL   RDW 40.912.3 81.111.0 - 91.415.0 %   Platelets 296 140 - 400 Thousand/uL   MPV 10.2 7.5 - 12.5 fL   Neutro Abs 4,984 1,500 - 7,800 cells/uL   Lymphs Abs 1,548 850 - 3,900 cells/uL   Absolute Monocytes 412 200 - 950 cells/uL   Eosinophils Absolute 114 15 - 500 cells/uL   Basophils Absolute 43 0 - 200 cells/uL   Neutrophils Relative % 70.2 %   Total Lymphocyte 21.8 %   Monocytes Relative 5.8 %   Eosinophils Relative 1.6 %   Basophils Relative 0.6 %  COMPLETE METABOLIC PANEL WITH GFR  Result Value Ref Range   Glucose, Bld 90 65 - 99 mg/dL   BUN 9 7 - 25 mg/dL   Creat 7.820.75 9.560.50 - 2.130.97 mg/dL   eGFR 086105 > OR = 60 VH/QIO/9.62X5mL/min/1.73m2   BUN/Creatinine Ratio NOT APPLICABLE 6 - 22 (calc)   Sodium 138 135 - 146 mmol/L   Potassium 4.2 3.5 - 5.3 mmol/L   Chloride 103 98 - 110 mmol/L   CO2 33 (H) 20 - 32 mmol/L   Calcium 10.1 8.6 - 10.2 mg/dL   Total Protein 7.1 6.1 - 8.1 g/dL   Albumin 4.9 3.6 - 5.1 g/dL   Globulin 2.2 1.9 - 3.7 g/dL (calc)   AG Ratio 2.2 1.0 - 2.5 (calc)   Total Bilirubin 0.6 0.2 - 1.2 mg/dL   Alkaline phosphatase (APISO) 46 31 - 125 U/L   AST 17 10 - 30 U/L   ALT 13 6 - 29 U/L  TSH  Result Value Ref Range   TSH 2.44 mIU/L  Lipid panel  Result Value Ref Range   Cholesterol 176 <200 mg/dL   HDL 58 > OR = 50 mg/dL   Triglycerides 284114 <132<150 mg/dL   LDL Cholesterol (Calc) 97 mg/dL (calc)   Total CHOL/HDL Ratio 3.0 <5.0 (calc)   Non-HDL Cholesterol (Calc) 118 <130 mg/dL (calc)  Assessment & Plan:   Problem List Items Addressed This Visit       Digestive   Gastroesophageal reflux disease without esophagitis - Primary    Worsening acid reflux, will get h. Pylori test,  continue omeprazole 20 mg  daily, start pepcid 20 mg two times a day.  If no improvement will refer to gi      Relevant Medications   famotidine (PEPCID) 20 MG tablet   Other Relevant Orders   H. pylori breath test     Other   Moderate episode of recurrent major depressive disorder    Mood has improved, no longer taking lexapro      Anxiety    Mood has improved, no longer taking lexapro        Follow up plan: No follow-ups on file.

## 2022-07-21 ENCOUNTER — Ambulatory Visit: Payer: BC Managed Care – PPO | Admitting: Nurse Practitioner

## 2022-07-21 ENCOUNTER — Other Ambulatory Visit: Payer: Self-pay

## 2022-07-21 ENCOUNTER — Encounter: Payer: Self-pay | Admitting: Nurse Practitioner

## 2022-07-21 VITALS — BP 120/68 | HR 82 | Temp 98.5°F | Resp 18 | Ht 65.0 in | Wt 130.5 lb

## 2022-07-21 DIAGNOSIS — Z0000 Encounter for general adult medical examination without abnormal findings: Secondary | ICD-10-CM | POA: Diagnosis not present

## 2022-07-21 DIAGNOSIS — F331 Major depressive disorder, recurrent, moderate: Secondary | ICD-10-CM | POA: Diagnosis not present

## 2022-07-21 DIAGNOSIS — Z8349 Family history of other endocrine, nutritional and metabolic diseases: Secondary | ICD-10-CM | POA: Diagnosis not present

## 2022-07-21 DIAGNOSIS — E785 Hyperlipidemia, unspecified: Secondary | ICD-10-CM | POA: Diagnosis not present

## 2022-07-21 DIAGNOSIS — F419 Anxiety disorder, unspecified: Secondary | ICD-10-CM | POA: Diagnosis not present

## 2022-07-21 DIAGNOSIS — K219 Gastro-esophageal reflux disease without esophagitis: Secondary | ICD-10-CM | POA: Diagnosis not present

## 2022-07-21 MED ORDER — FAMOTIDINE 20 MG PO TABS
20.0000 mg | ORAL_TABLET | Freq: Two times a day (BID) | ORAL | 1 refills | Status: AC
Start: 2022-07-21 — End: ?

## 2022-07-21 NOTE — Assessment & Plan Note (Signed)
Mood has improved, no longer taking lexapro 

## 2022-07-21 NOTE — Assessment & Plan Note (Signed)
Mood has improved, no longer taking lexapro

## 2022-07-21 NOTE — Assessment & Plan Note (Signed)
Worsening acid reflux, will get h. Pylori test,  continue omeprazole 20 mg daily, start pepcid 20 mg two times a day.  If no improvement will refer to gi

## 2022-07-26 ENCOUNTER — Encounter: Payer: Self-pay | Admitting: Nurse Practitioner

## 2022-07-26 LAB — CBC WITH DIFFERENTIAL/PLATELET
Absolute Monocytes: 418 cells/uL (ref 200–950)
Basophils Absolute: 52 cells/uL (ref 0–200)
Basophils Relative: 0.9 %
Eosinophils Absolute: 70 cells/uL (ref 15–500)
Eosinophils Relative: 1.2 %
HCT: 36.1 % (ref 35.0–45.0)
Hemoglobin: 12 g/dL (ref 11.7–15.5)
Lymphs Abs: 1705 cells/uL (ref 850–3900)
MCH: 29.4 pg (ref 27.0–33.0)
MCHC: 33.2 g/dL (ref 32.0–36.0)
MCV: 88.5 fL (ref 80.0–100.0)
MPV: 10.5 fL (ref 7.5–12.5)
Monocytes Relative: 7.2 %
Neutro Abs: 3555 cells/uL (ref 1500–7800)
Neutrophils Relative %: 61.3 %
Platelets: 290 10*3/uL (ref 140–400)
RBC: 4.08 10*6/uL (ref 3.80–5.10)
RDW: 12.2 % (ref 11.0–15.0)
Total Lymphocyte: 29.4 %
WBC: 5.8 10*3/uL (ref 3.8–10.8)

## 2022-07-26 LAB — COMPLETE METABOLIC PANEL WITH GFR
AG Ratio: 1.8 (calc) (ref 1.0–2.5)
ALT: 14 U/L (ref 6–29)
AST: 17 U/L (ref 10–30)
Albumin: 4.4 g/dL (ref 3.6–5.1)
Alkaline phosphatase (APISO): 36 U/L (ref 31–125)
BUN: 15 mg/dL (ref 7–25)
CO2: 27 mmol/L (ref 20–32)
Calcium: 9.7 mg/dL (ref 8.6–10.2)
Chloride: 105 mmol/L (ref 98–110)
Creat: 0.7 mg/dL (ref 0.50–0.97)
Globulin: 2.5 g/dL (calc) (ref 1.9–3.7)
Glucose, Bld: 92 mg/dL (ref 65–99)
Potassium: 4.2 mmol/L (ref 3.5–5.3)
Sodium: 138 mmol/L (ref 135–146)
Total Bilirubin: 0.4 mg/dL (ref 0.2–1.2)
Total Protein: 6.9 g/dL (ref 6.1–8.1)
eGFR: 113 mL/min/{1.73_m2} (ref >=60)

## 2022-07-26 LAB — LIPID PANEL
Cholesterol: 172 mg/dL (ref <200)
HDL: 67 mg/dL (ref >=50)
LDL Cholesterol (Calc): 89 mg/dL (calc)
Non-HDL Cholesterol (Calc): 105 mg/dL (calc) (ref <130)
Total CHOL/HDL Ratio: 2.6 (calc) (ref <5.0)
Triglycerides: 70 mg/dL (ref <150)

## 2022-07-26 LAB — H. PYLORI BREATH TEST: H. pylori Breath Test: NOT DETECTED

## 2022-07-26 LAB — TSH: TSH: 3.2 mIU/L

## 2022-12-27 NOTE — Progress Notes (Signed)
Name: Leslie [name]   MRN: [id number]    DOB: 09/20/1985   Date:12/29/2022       Progress Note  Subjective  Chief Complaint  Chief Complaint  Patient presents with   Annual Exam    HPI  Patient presents for annual CPE.  Diet: Regular, well balanced diet Exercise: None , does have a physical job, recommend 150 min of physical activity weekly   Last Eye Exam: 2015 Last Dental Exam: 2022  Flowsheet Row Office Visit from 12/29/2022 in [facility]  AUDIT-C Score 0      Depression: Phq 9 is  positive    12/29/2022    9:56 AM 07/21/2022    2:56 PM 01/04/2022   10:28 AM 12/26/2021   10:12 AM 12/07/2021    1:13 PM  Depression screen PHQ 2/9  Decreased Interest 1 0 0 0 2  Down, Depressed, Hopeless 1 0 0 1 3  PHQ - 2 Score 2 0 0 1 5  Altered sleeping 1 0 0 0 3  Tired, decreased energy 1 0 0 0 3  Change in appetite 0 0 0 0 3  Feeling bad or failure about yourself  1 0 0 0 2  Trouble concentrating 1 0 0 0 2  Moving slowly or fidgety/restless 0 0 0 0 0  Suicidal thoughts 0 0 0 0 0  PHQ-9 Score 6 0 0 1 18  Difficult doing work/chores Not difficult at all Not difficult at all Not difficult at all Somewhat difficult Very difficult   Hypertension: BP Readings from Last 3 Encounters:  12/29/22 118/76  07/21/22 120/68  12/26/21 118/80   Obesity: Wt Readings from Last 3 Encounters:  12/29/22 125 lb (56.7 kg)  07/21/22 130 lb 8 oz (59.2 kg)  12/26/21 129 lb 14.4 oz (58.9 kg)   BMI Readings from Last 3 Encounters:  12/29/22 20.80 kg/m  07/21/22 21.72 kg/m  12/26/21 21.62 kg/m     Vaccines:  HPV: up to at age 76 , ask insurance if age between 56-45  Shingrix: 55-64 yo and ask insurance if covered when patient above 60 yo Pneumonia:  educated and discussed with patient. Flu:  educated and discussed with patient.     Hep C Screening: completed STD testing and prevention (HIV/chl/gon/syphilis): completed Intimate partner violence: negative screen   Sexual History : sexually active, IUD Menstrual History/LMP/Abnormal Bleeding: IUD Discussed importance of follow up if any post-menopausal bleeding: yes  Incontinence Symptoms: negative for symptoms   Breast cancer:  - Last Mammogram: NA, starts next year - BRCA gene screening: none  Osteoporosis Prevention : Discussed high calcium and vitamin D supplementation, weight bearing exercises Bone density :not applicable   Cervical cancer screening: 10/10/2018,  due in 2025  Skin cancer: Discussed monitoring for atypical lesions  Colorectal cancer: NA   Lung cancer:  Low Dose CT Chest recommended if Age 62-80 years, 20 pack-year currently smoking OR have quit w/in 15years. Patient does not qualify for screen   ECG:   Advanced Care Planning: A voluntary discussion about advance care planning including the explanation and discussion of advance directives.  Discussed health care proxy and Living will, and the patient was able to identify a health care proxy as mom.  Patient does not have a living will and power of attorney of health care   Lipids: Lab Results  Component Value Date   CHOL 172 07/21/2022   CHOL 176 05/03/2021   CHOL 182 12/23/2020   Lab  Results  Component Value Date   HDL 67 07/21/2022   HDL 58 05/03/2021   HDL 74 12/23/2020   Lab Results  Component Value Date   LDLCALC 89 07/21/2022   LDLCALC 97 05/03/2021   LDLCALC 91 12/23/2020   Lab Results  Component Value Date   TRIG 70 07/21/2022   TRIG 114 05/03/2021   TRIG 76 12/23/2020   Lab Results  Component Value Date   CHOLHDL 2.6 07/21/2022   CHOLHDL 3.0 05/03/2021   CHOLHDL 2.5 12/23/2020   No results found for: "LDLDIRECT"  Glucose: Glucose  Date Value Ref Range Status  01/09/2014 81 65 - 99 mg/dL Final  30/86/5784 93 65 - 99 mg/dL Final  69/62/9528 74 65 - 99 mg/dL Final   Glucose, Bld  Date Value Ref Range Status  07/21/2022 92 65 - 99 mg/dL Final    Comment:    .            Fasting reference  interval .   05/03/2021 90 65 - 99 mg/dL Final    Comment:    .            Fasting reference interval .   04/12/2021 73 65 - 99 mg/dL Final    Comment:    .            Fasting reference interval .     Patient Active Problem List   Diagnosis Date Noted   Moderate episode of recurrent major depressive disorder (HCC) 12/07/2021   Anxiety 12/07/2021   Insomnia due to other mental disorder 07/06/2021   Severe depression (HCC) 05/17/2021   Panic anxiety syndrome 05/03/2021   Hyperlipidemia 12/23/2019   Family history of thyroid disease 12/23/2019   Family history of diabetes mellitus in mother 12/23/2019   Gastroesophageal reflux disease without esophagitis 09/09/2018   Lumbosacral radiculopathy 01/07/2015    Past Surgical History:  Procedure Laterality Date   BREAST SURGERY     augmentation   CESAREAN SECTION  2005   CESAREAN SECTION  2016   CESAREAN SECTION  2005, 2016   CHOLECYSTECTOMY N/A 02/14/2016   Procedure: LAPAROSCOPIC CHOLECYSTECTOMY WITH INTRAOPERATIVE CHOLANGIOGRAM;  Surgeon: Earline Mayotte, MD;  Location: [facility] ORS;  Service: General;  Laterality: N/A;   TONSILLECTOMY AND ADENOIDECTOMY  1987   TONSILLECTOMY AND ADENOIDECTOMY  1987    Family History  Problem Relation Age of Onset   Diabetes Mother    Thyroid disease Maternal Aunt    Ovarian cancer Maternal Grandmother    Heart attack Maternal Grandmother    Cervical cancer Maternal Grandmother    Heart attack Maternal Grandfather    Emphysema Paternal Grandmother     Social History   Socioeconomic History   Marital status: Married    Spouse name: Dorene Sorrow   Number of children: 2   Years of education: 11   Highest education level: 11th grade  Occupational History   Not on file  Tobacco Use   Smoking status: Former    Current packs/day: 0.00    Average packs/day: 0.3 packs/day for 15.0 years (5.0 ttl pk-yrs)    Types: Cigarettes    Start date: 05/11/2005    Quit date: 05/11/2020    Years since  quitting: 2.6   Smokeless tobacco: Never   Tobacco comments:    15 years smoking hx 1/3 ppd  Vaping Use   Vaping status: Never Used  Substance and Sexual Activity   Alcohol use: Yes    Alcohol/week: 7.0 standard drinks  of alcohol    Types: 7 Glasses of wine per week    Comment: 1 glass of wine   Drug use: No    Comment: pt denies during phone interview but UDS was + for marijuana in 2016   Sexual activity: Yes    Birth control/protection: I.U.D.  Other Topics Concern   Not on file  Social History Narrative   Not on file   Social Determinants of Health   Financial Resource Strain: Low Risk  (12/29/2022)   Overall Financial Resource Strain (CARDIA)    Difficulty of Paying Living Expenses: Not hard at all  Food Insecurity: No Food Insecurity (12/29/2022)   Hunger Vital Sign    Worried About Running Out of Food in the Last Year: Never true    Ran Out of Food in the Last Year: Never true  Transportation Needs: No Transportation Needs (12/29/2022)   PRAPARE - Administrator, Civil Service (Medical): No    Lack of Transportation (Non-Medical): No  Physical Activity: Inactive (12/29/2022)   Exercise Vital Sign    Days of Exercise per Week: 0 days    Minutes of Exercise per Session: 0 min  Stress: No Stress Concern Present (12/29/2022)   [employer] of Occupational Health - Occupational Stress Questionnaire    Feeling of Stress : Only a little  Social Connections: Moderately Isolated (12/29/2022)   Social Connection and Isolation Panel [NHANES]    Frequency of Communication with Friends and Family: More than three times a week    Frequency of Social Gatherings with Friends and Family: More than three times a week    Attends Religious Services: Never    Database administrator or Organizations: No    Attends Banker Meetings: Never    Marital Status: Married  Catering manager Violence: Not At Risk (12/29/2022)   Humiliation, Afraid, Rape, and Kick  questionnaire    Fear of Current or Ex-Partner: No    Emotionally Abused: No    Physically Abused: No    Sexually Abused: No     Current Outpatient Medications:    albuterol (VENTOLIN HFA) 108 (90 Base) MCG/ACT inhaler, Inhale 2 puffs into the lungs every 4 (four) hours as needed for wheezing or shortness of breath., Disp: 18 g, Rfl: 0   busPIRone (BUSPAR) 5 MG tablet, TAKE 1-2 TABLETS (5-10 MG TOTAL) BY MOUTH 2 (TWO) TIMES DAILY AS NEEDED (ANXIETY SYMTPOMS)., Disp: 360 tablet, Rfl: 1   famotidine (PEPCID) 20 MG tablet, Take 1 tablet (20 mg total) by mouth 2 (two) times daily., Disp: 120 tablet, Rfl: 1   fluticasone (FLONASE) 50 MCG/ACT nasal spray, SPRAY 2 SPRAYS INTO EACH NOSTRIL EVERY DAY, Disp: 48 mL, Rfl: 1   hydrOXYzine (VISTARIL) 25 MG capsule, Take 1 capsule (25 mg total) by mouth at bedtime as needed for anxiety (insomnia)., Disp: 30 capsule, Rfl: 0   levocetirizine (XYZAL) 5 MG tablet, TAKE 1 TABLET BY MOUTH EVERY DAY IN THE EVENING, Disp: 90 tablet, Rfl: 1   levonorgestrel (MIRENA) 20 MCG/24HR IUD, 1 each by Intrauterine route once., Disp: , Rfl:    omeprazole (PRILOSEC) 20 MG capsule, TAKE 1 CAPSULE BY MOUTH EVERY DAY, Disp: 90 capsule, Rfl: 3   propranolol (INDERAL) 10 MG tablet, TAKE 1 TABLET BY MOUTH THREE TIMES A DAY AS NEEDED, Disp: 90 tablet, Rfl: 0  Allergies  Allergen Reactions   Amoxicillin Hives    Has patient had a PCN reaction causing immediate rash, facial/tongue/throat swelling, SOB  or lightheadedness with hypotension: {no Has patient had a PCN reaction causing severe rash involving mucus membranes or skin necrosis: no Has patient had a PCN reaction that required hospitalization no Has patient had a PCN reaction occurring within the last 10 years: no If all of the above answers are "NO", then may proceed with Cephalosporin use.   Bactrim [Sulfamethoxazole-Trimethoprim] Hives   Penicillins Hives    Has patient had a PCN reaction causing immediate rash,  facial/tongue/throat swelling, SOB or lightheadedness with hypotension: {no Has patient had a PCN reaction causing severe rash involving mucus membranes or skin necrosis: {no Has patient had a PCN reaction that required hospitalization no Has patient had a PCN reaction occurring within the last 10 years: {no If all of the above answers are "NO", then may proceed with Cephalosporin use.   Denture Adhesive Rash   Tape Rash    PT TOLERATES PAPER TAPE WELL     ROS  Constitutional: Negative for fever or weight change.  Respiratory: Negative for cough and shortness of breath.   Cardiovascular: Negative for chest pain or palpitations.  Gastrointestinal: Negative for abdominal pain, no bowel changes.  Musculoskeletal: Negative for gait problem or joint swelling.  Skin: Negative for rash.  Neurological: Negative for dizziness or headache.  No other specific complaints in a complete review of systems (except as listed in HPI above).   Objective  Vitals:   12/29/22 0957  BP: 118/76  Pulse: 84  Resp: 16  Temp: 98.2 F (36.8 C)  TempSrc: Oral  SpO2: 98%  Weight: 125 lb (56.7 kg)  Height: 5\' 5"  (1.651 m)    Body mass index is 20.8 kg/m.  Physical Exam Constitutional: Patient appears well-developed and well-nourished. No distress.  HENT: Head: Normocephalic and atraumatic. Ears: B TMs ok, no erythema or effusion; Nose: Nose normal. Mouth/Throat: Oropharynx is clear and moist. No oropharyngeal exudate.  Eyes: Conjunctivae and EOM are normal. Pupils are equal, round, and reactive to light. No scleral icterus.  Neck: Normal range of motion. Neck supple. No JVD present. No thyromegaly present.  Cardiovascular: Normal rate, regular rhythm and normal heart sounds.  No murmur heard. No BLE edema. Pulmonary/Chest: Effort normal and breath sounds normal. No respiratory distress. Abdominal: Soft. Bowel sounds are normal, no distension. There is no tenderness. no masses Breast: no lumps or  masses, no nipple discharge or rashes Musculoskeletal: Normal range of motion, no joint effusions. No gross deformities Neurological: he is alert and oriented to person, place, and time. No cranial nerve deficit. Coordination, balance, strength, speech and gait are normal.  Skin: Skin is warm and dry. No rash noted. No erythema.  Psychiatric: Patient has a normal mood and affect. behavior is normal. Judgment and thought content normal.   No results found for this or any previous visit (from the past 2160 hour(s)).   Fall Risk:    12/29/2022    9:55 AM 07/21/2022    2:56 PM 01/04/2022   10:27 AM 12/26/2021   10:12 AM 12/07/2021    1:11 PM  Fall Risk   Falls in the past year? 0 0 0 0 0  Number falls in past yr: 0 0 0 0 0  Injury with Fall? 0 0 0 0 0  Risk for fall due to : No Fall Risks  No Fall Risks No Fall Risks   Follow up Falls prevention discussed  Falls prevention discussed;Education provided Falls prevention discussed;Education provided Falls evaluation completed     Functional Status Survey:  Is the patient deaf or have difficulty hearing?: No Does the patient have difficulty seeing, even when wearing glasses/contacts?: No Does the patient have difficulty concentrating, remembering, or making decisions?: No Does the patient have difficulty walking or climbing stairs?: No Does the patient have difficulty dressing or bathing?: No   Assessment & Plan  1. Annual physical exam Increase physical activity Recent lab work within normal limits Up to date on pap Discussed self breast exams   -USPSTF grade A and B recommendations reviewed with patient; age-appropriate recommendations, preventive care, screening tests, etc discussed and encouraged; healthy living encouraged; see AVS for patient education given to patient -Discussed importance of 150 minutes of physical activity weekly, eat two servings of fish weekly, eat one serving of tree nuts ( cashews, pistachios, pecans,  almonds.Marland Kitchen) every other day, eat 6 servings of fruit/vegetables daily and drink plenty of water and avoid sweet beverages.   -Reviewed Health Maintenance: Yes.

## 2022-12-29 ENCOUNTER — Ambulatory Visit (INDEPENDENT_AMBULATORY_CARE_PROVIDER_SITE_OTHER): Payer: BC Managed Care – PPO | Admitting: Nurse Practitioner

## 2022-12-29 ENCOUNTER — Other Ambulatory Visit: Payer: Self-pay

## 2022-12-29 ENCOUNTER — Encounter: Payer: Self-pay | Admitting: Nurse Practitioner

## 2022-12-29 VITALS — BP 118/76 | HR 84 | Temp 98.2°F | Resp 16 | Ht 65.0 in | Wt 125.0 lb

## 2022-12-29 DIAGNOSIS — Z0000 Encounter for general adult medical examination without abnormal findings: Secondary | ICD-10-CM | POA: Diagnosis not present

## 2023-02-06 ENCOUNTER — Encounter: Payer: Self-pay | Admitting: Nurse Practitioner

## 2023-04-01 ENCOUNTER — Ambulatory Visit
Admission: EM | Admit: 2023-04-01 | Discharge: 2023-04-01 | Disposition: A | Discharge status: Home / Self Care | Payer: BC Managed Care – PPO | Attending: Emergency Medicine | Admitting: Emergency Medicine

## 2023-04-01 DIAGNOSIS — J209 Acute bronchitis, unspecified: Secondary | ICD-10-CM | POA: Diagnosis not present

## 2023-04-01 DIAGNOSIS — J45909 Unspecified asthma, uncomplicated: Secondary | ICD-10-CM | POA: Diagnosis not present

## 2023-04-01 DIAGNOSIS — J069 Acute upper respiratory infection, unspecified: Secondary | ICD-10-CM | POA: Diagnosis not present

## 2023-04-01 LAB — SARS CORONAVIRUS 2 BY RT PCR: SARS Coronavirus 2 by RT PCR: NEGATIVE

## 2023-04-01 MED ORDER — PROMETHAZINE-DM 6.25-15 MG/5ML PO SYRP: 5.0000 mL | ORAL_SOLUTION | Freq: Four times a day (QID) | ORAL | 0 refills | Status: DC | PRN

## 2023-04-01 MED ORDER — ALBUTEROL SULFATE HFA 108 (90 BASE) MCG/ACT IN AERS
1.0000 | INHALATION_SPRAY | Freq: Four times a day (QID) | RESPIRATORY_TRACT | 0 refills | Status: DC | PRN
Start: 2023-04-01 — End: 2024-01-08

## 2023-04-01 NOTE — Discharge Instructions (Signed)
Most likely you have a viral illness: no antibiotic is indicated at this time, May treat with OTC meds of choice. Make sure to drink plenty of fluids to stay hydrated(gatorade, water, popsicles,jello,etc), avoid caffeine products. Follow up with PCP. Return as needed.

## 2023-04-01 NOTE — ED Provider Notes (Signed)
[facility]    CSN: [id number] Arrival date & time: 04/01/23  1346      History   Chief Complaint Chief Complaint  Patient presents with   Cough   Headache   Fatigue   Back Pain    HPI Leslie [name] is a 37 y.o. female.   37 year old female pt, Leslie [name], presents to urgent care for evaluation of productive cough x 3 days, fatigue, chills, headache, back pain with coughing. 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   The history is provided by the patient. No language interpreter was used.  Cough Associated symptoms: chills, headaches and myalgias   Headache Associated symptoms: back pain, cough, fatigue and myalgias   Back Pain Associated symptoms: headaches     Past Medical History:  Diagnosis Date   Anxiety    Back injury    Bulging lumbar disc    Cholecystitis, chronic 02/23/2016   Complication of anesthesia    Current moderate episode of major depressive disorder without prior episode (HCC) 09/09/2018   Family history of adverse reaction to anesthesia    N/V-Mom   GERD (gastroesophageal reflux disease)    Headache    H/O   PONV (postoperative nausea and vomiting)     Patient Active Problem List   Diagnosis Date Noted   Viral URI with cough 04/01/2023   Moderate episode of recurrent major depressive disorder (HCC) 12/07/2021   Anxiety 12/07/2021   Insomnia due to other mental disorder 07/06/2021   Severe depression (HCC) 05/17/2021   Panic anxiety syndrome 05/03/2021   Hyperlipidemia 12/23/2019   Family history of thyroid disease 12/23/2019   Family history of diabetes mellitus in mother 12/23/2019   Gastroesophageal reflux disease without esophagitis 09/09/2018   Lumbosacral radiculopathy 01/07/2015    Past Surgical History:  Procedure Laterality Date   BREAST SURGERY     augmentation   CESAREAN SECTION  2005   CESAREAN SECTION  2016   CESAREAN SECTION  2005, 2016   CHOLECYSTECTOMY N/A 02/14/2016   Procedure: LAPAROSCOPIC  CHOLECYSTECTOMY WITH INTRAOPERATIVE CHOLANGIOGRAM;  Surgeon: Earline Mayotte, MD;  Location: [facility] ORS;  Service: General;  Laterality: N/A;   TONSILLECTOMY AND ADENOIDECTOMY  1987   TONSILLECTOMY AND ADENOIDECTOMY  1987    OB History     Gravida  5   Para  2   Term  0   Preterm  0   AB  2   Living         SAB  1   IAB  0   Ectopic  0   Multiple      Live Births           Obstetric Comments  1st Menstrual Cycle:  10  1st Pregnancy:  17  1st Menstrual Cycle:  10 1st Pregnancy:  17           Home Medications    Prior to Admission medications   Medication Sig Start Date End Date Taking? Authorizing Provider  promethazine-dextromethorphan (PROMETHAZINE-DM) 6.25-15 MG/5ML syrup Take 5 mLs by mouth 4 (four) times daily as needed for cough. 04/01/23  Yes Sharlena Kristensen, Para March, NP  albuterol (VENTOLIN HFA) 108 (90 Base) MCG/ACT inhaler Inhale 1-2 puffs into the lungs every 6 (six) hours as needed for wheezing or shortness of breath. 04/01/23   Jalynn Waddell, Para March, NP  busPIRone (BUSPAR) 5 MG tablet TAKE 1-2 TABLETS (5-10 MG TOTAL) BY MOUTH 2 (TWO) TIMES DAILY AS NEEDED (ANXIETY SYMTPOMS). 01/30/22  Berniece Salines, FNP  famotidine (PEPCID) 20 MG tablet Take 1 tablet (20 mg total) by mouth 2 (two) times daily. 07/21/22   Berniece Salines, FNP  fluticasone (FLONASE) 50 MCG/ACT nasal spray SPRAY 2 SPRAYS INTO EACH NOSTRIL EVERY DAY 07/05/21   Danelle Berry, PA-C  hydrOXYzine (VISTARIL) 25 MG capsule Take 1 capsule (25 mg total) by mouth at bedtime as needed for anxiety (insomnia). 12/26/21   Danelle Berry, PA-C  levocetirizine (XYZAL) 5 MG tablet TAKE 1 TABLET BY MOUTH EVERY DAY IN THE EVENING 04/04/22   Danelle Berry, PA-C  levonorgestrel (MIRENA) 20 MCG/24HR IUD 1 each by Intrauterine route once.    [provider]  omeprazole (PRILOSEC) 20 MG capsule TAKE 1 CAPSULE BY MOUTH EVERY DAY 12/30/21   Berniece Salines, FNP  propranolol (INDERAL) 10 MG tablet TAKE 1 TABLET BY  MOUTH THREE TIMES A DAY AS NEEDED 07/02/21   Danelle Berry, PA-C    Family History Family History  Problem Relation Age of Onset   Diabetes Mother    Thyroid disease Maternal Aunt    Ovarian cancer Maternal Grandmother    Heart attack Maternal Grandmother    Cervical cancer Maternal Grandmother    Heart attack Maternal Grandfather    Emphysema Paternal Grandmother     Social History Social History   Tobacco Use   Smoking status: Former    Current packs/day: 0.00    Average packs/day: 0.3 packs/day for 15.0 years (5.0 ttl pk-yrs)    Types: Cigarettes    Start date: 05/11/2005    Quit date: 05/11/2020    Years since quitting: 2.8   Smokeless tobacco: Never   Tobacco comments:    15 years smoking hx 1/3 ppd  Vaping Use   Vaping status: Never Used  Substance Use Topics   Alcohol use: Yes    Alcohol/week: 7.0 standard drinks of alcohol    Types: 7 Glasses of wine per week    Comment: 1 glass of wine   Drug use: No    Comment: pt denies during phone interview but UDS was + for marijuana in 2016     Allergies   Amoxicillin, Bactrim [sulfamethoxazole-trimethoprim], Penicillins, Denture adhesive, and Tape   Review of Systems Review of Systems  Constitutional:  Positive for chills and fatigue.  Respiratory:  Positive for cough.   Musculoskeletal:  Positive for back pain and myalgias.  Neurological:  Positive for headaches.  All other systems reviewed and are negative.    Physical Exam Triage Vital Signs ED Triage Vitals  Encounter Vitals Group     BP 04/01/23 1411 (!) 137/94     Systolic BP Percentile --      Diastolic BP Percentile --      Pulse Rate 04/01/23 1409 99     Resp 04/01/23 1409 19     Temp 04/01/23 1409 98.3 F (36.8 C)     Temp Source 04/01/23 1409 Oral     SpO2 04/01/23 1409 95 %     Weight --      Height --      Head Circumference --      Peak Flow --      Pain Score 04/01/23 1408 7     Pain Loc --      Pain Education --      Exclude from  Growth Chart --    No data found.  Updated Vital Signs BP (!) 137/94 (BP Location: Right Arm)   Pulse 99  Temp 98.3 F (36.8 C) (Oral)   Resp 19   LMP 03/29/2023   SpO2 95%   Visual Acuity Right Eye Distance:   Left Eye Distance:   Bilateral Distance:    Right Eye Near:   Left Eye Near:    Bilateral Near:     Physical Exam Vitals and nursing note reviewed.  Constitutional:      General: She is not in acute distress.    Appearance: She is well-developed.  HENT:     Head: Normocephalic.     Right Ear: Tympanic membrane is retracted.     Left Ear: Tympanic membrane is retracted.     Nose: Congestion present.     Mouth/Throat:     Lips: Pink.     Mouth: Mucous membranes are moist.     Pharynx: Oropharynx is clear.  Eyes:     General: Lids are normal.     Conjunctiva/sclera: Conjunctivae normal.     Pupils: Pupils are equal, round, and reactive to light.  Neck:     Trachea: No tracheal deviation.  Cardiovascular:     Rate and Rhythm: Normal rate.     Pulses: Normal pulses.     Heart sounds: Normal heart sounds. No murmur heard. Pulmonary:     Effort: Pulmonary effort is normal.     Breath sounds: Normal breath sounds and air entry.  Abdominal:     General: Bowel sounds are normal.     Palpations: Abdomen is soft.     Tenderness: There is no abdominal tenderness.  Musculoskeletal:        General: Normal range of motion.     Cervical back: Normal range of motion.  Lymphadenopathy:     Cervical: No cervical adenopathy.  Skin:    General: Skin is warm and dry.     Findings: No rash.  Neurological:     General: No focal deficit present.     Mental Status: She is alert and oriented to person, place, and time.     GCS: GCS eye subscore is 4. GCS verbal subscore is 5. GCS motor subscore is 6.  Psychiatric:        Attention and Perception: Attention normal.        Mood and Affect: Mood normal.        Speech: Speech normal.        Behavior: Behavior normal.  Behavior is cooperative.      UC Treatments / Results  Labs (all labs ordered are listed, but only abnormal results are displayed) Labs Reviewed  SARS CORONAVIRUS 2 BY RT PCR    EKG   Radiology No results found.  Procedures Procedures (including critical care time)  Medications Ordered in UC Medications - No data to display  Initial Impression / Assessment and Plan / UC Course  I have reviewed the triage vital signs and the nursing notes.  Pertinent labs & imaging results that were available during my care of the patient were reviewed by me and considered in my medical decision making (see chart for details).  Clinical Course as of 04/01/23 1646  Sun Apr 01, 2023  1500 Covid test is negative [JD]    Clinical Course User Index [JD] Demiyah Fischbach, Para March, NP   Discussed exam findings and plan of care with patient, strict go to ER precautions given.   Patient verbalized understanding to this provider.  Ddx: Viral uri w cough, allergies Final Clinical Impressions(s) / UC Diagnoses   Final diagnoses:  Viral  URI with cough     Discharge Instructions      Most likely you have a viral illness: no antibiotic is indicated at this time, May treat with OTC meds of choice. Make sure to drink plenty of fluids to stay hydrated(gatorade, water, popsicles,jello,etc), avoid caffeine products. Follow up with PCP. Return as needed.     ED Prescriptions     Medication Sig Dispense Auth. Provider   albuterol (VENTOLIN HFA) 108 (90 Base) MCG/ACT inhaler Inhale 1-2 puffs into the lungs every 6 (six) hours as needed for wheezing or shortness of breath. 18 g Ausar Georgiou, NP   promethazine-dextromethorphan (PROMETHAZINE-DM) 6.25-15 MG/5ML syrup Take 5 mLs by mouth 4 (four) times daily as needed for cough. 118 mL Han Lysne, Para March, NP      PDMP not reviewed this encounter.   Clancy Gourd, NP 04/01/23 [phone]

## 2023-04-01 NOTE — ED Triage Notes (Signed)
Sx x 3 days  Productive cough green mucus worse at night.  Fatigue Chills Headache Back pain when she coughs   Taking day and nyquil

## 2023-04-02 ENCOUNTER — Encounter: Payer: Self-pay | Admitting: Nurse Practitioner

## 2023-05-13 ENCOUNTER — Ambulatory Visit
Admission: RE | Admit: 2023-05-13 | Discharge: 2023-05-13 | Disposition: A | Discharge status: Home / Self Care | Payer: BC Managed Care – PPO | Source: Ambulatory Visit | Attending: Physician Assistant | Admitting: Physician Assistant

## 2023-05-13 VITALS — BP 112/80 | HR 91 | Temp 98.5°F | Resp 14 | Ht 65.0 in | Wt 120.0 lb

## 2023-05-13 DIAGNOSIS — B029 Zoster without complications: Secondary | ICD-10-CM | POA: Diagnosis not present

## 2023-05-13 DIAGNOSIS — R21 Rash and other nonspecific skin eruption: Secondary | ICD-10-CM | POA: Diagnosis not present

## 2023-05-13 MED ORDER — HYDROCODONE-ACETAMINOPHEN 5-325 MG PO TABS: 1.0000 | ORAL_TABLET | Freq: Three times a day (TID) | ORAL | 0 refills | Status: AC | PRN

## 2023-05-13 MED ORDER — VALACYCLOVIR HCL 1 G PO TABS: 1000.0000 mg | ORAL_TABLET | Freq: Three times a day (TID) | ORAL | 0 refills | Status: DC

## 2023-05-13 NOTE — ED Triage Notes (Signed)
Patient reports red itchy and burning rash on her neck, back and chest but all on the left side on Friday.  Patient denies fevers.

## 2023-05-13 NOTE — ED Provider Notes (Signed)
[facility]    CSN: [id number] Arrival date & time: 05/13/23  1144      History   Chief Complaint Chief Complaint  Patient presents with   Rash    APPOINTMENT    HPI Leslie [name] is a 38 y.o. female presenting for painful vesicular rash of left side of neck, chest and shoulder x 2 days.  Denies injury.  Patient reports pain even with her clothes touching her skin.  Has been taking Tylenol and Motrin without relief.  HPI  Past Medical History:  Diagnosis Date   Anxiety    Back injury    Bulging lumbar disc    Cholecystitis, chronic 02/23/2016   Complication of anesthesia    Current moderate episode of major depressive disorder without prior episode (HCC) 09/09/2018   Family history of adverse reaction to anesthesia    N/V-Mom   GERD (gastroesophageal reflux disease)    Headache    H/O   PONV (postoperative nausea and vomiting)     Patient Active Problem List   Diagnosis Date Noted   Viral URI with cough 04/01/2023   Moderate episode of recurrent major depressive disorder (HCC) 12/07/2021   Anxiety 12/07/2021   Insomnia due to other mental disorder 07/06/2021   Severe depression (HCC) 05/17/2021   Panic anxiety syndrome 05/03/2021   Hyperlipidemia 12/23/2019   Family history of thyroid disease 12/23/2019   Family history of diabetes mellitus in mother 12/23/2019   Gastroesophageal reflux disease without esophagitis 09/09/2018   Lumbosacral radiculopathy 01/07/2015    Past Surgical History:  Procedure Laterality Date   BREAST SURGERY     augmentation   CESAREAN SECTION  2005   CESAREAN SECTION  2016   CESAREAN SECTION  2005, 2016   CHOLECYSTECTOMY N/A 02/14/2016   Procedure: LAPAROSCOPIC CHOLECYSTECTOMY WITH INTRAOPERATIVE CHOLANGIOGRAM;  Surgeon: Earline Mayotte, MD;  Location: [facility] ORS;  Service: General;  Laterality: N/A;   TONSILLECTOMY AND ADENOIDECTOMY  1987   TONSILLECTOMY AND ADENOIDECTOMY  1987    OB History     Gravida  5   Para   2   Term  0   Preterm  0   AB  2   Living         SAB  1   IAB  0   Ectopic  0   Multiple      Live Births           Obstetric Comments  1st Menstrual Cycle:  10  1st Pregnancy:  17  1st Menstrual Cycle:  10 1st Pregnancy:  17           Home Medications    Prior to Admission medications   Medication Sig Start Date End Date Taking? Authorizing Provider  HYDROcodone-acetaminophen (NORCO) 5-325 MG tablet Take 1 tablet by mouth every 8 (eight) hours as needed for up to 5 days for moderate pain (pain score 4-6). 05/13/23 05/18/23 Yes Shirlee Latch, PA-C  valACYclovir (VALTREX) 1000 MG tablet Take 1 tablet (1,000 mg total) by mouth 3 (three) times daily. 05/13/23  Yes Shirlee Latch, PA-C  albuterol (VENTOLIN HFA) 108 (90 Base) MCG/ACT inhaler Inhale 1-2 puffs into the lungs every 6 (six) hours as needed for wheezing or shortness of breath. 04/01/23   Defelice, Para March, NP  busPIRone (BUSPAR) 5 MG tablet TAKE 1-2 TABLETS (5-10 MG TOTAL) BY MOUTH 2 (TWO) TIMES DAILY AS NEEDED (ANXIETY SYMTPOMS). 01/30/22   Berniece Salines, FNP  famotidine (PEPCID) 20  MG tablet Take 1 tablet (20 mg total) by mouth 2 (two) times daily. 07/21/22   Berniece Salines, FNP  fluticasone (FLONASE) 50 MCG/ACT nasal spray SPRAY 2 SPRAYS INTO EACH NOSTRIL EVERY DAY 07/05/21   Danelle Berry, PA-C  hydrOXYzine (VISTARIL) 25 MG capsule Take 1 capsule (25 mg total) by mouth at bedtime as needed for anxiety (insomnia). 12/26/21   Danelle Berry, PA-C  levocetirizine (XYZAL) 5 MG tablet TAKE 1 TABLET BY MOUTH EVERY DAY IN THE EVENING 04/04/22   Danelle Berry, PA-C  levonorgestrel (MIRENA) 20 MCG/24HR IUD 1 each by Intrauterine route once.    [provider]  omeprazole (PRILOSEC) 20 MG capsule TAKE 1 CAPSULE BY MOUTH EVERY DAY 12/30/21   Berniece Salines, FNP  promethazine-dextromethorphan (PROMETHAZINE-DM) 6.25-15 MG/5ML syrup Take 5 mLs by mouth 4 (four) times daily as needed for cough. 04/01/23   Defelice,  Para March, NP  propranolol (INDERAL) 10 MG tablet TAKE 1 TABLET BY MOUTH THREE TIMES A DAY AS NEEDED 07/02/21   Danelle Berry, PA-C    Family History Family History  Problem Relation Age of Onset   Diabetes Mother    Thyroid disease Maternal Aunt    Ovarian cancer Maternal Grandmother    Heart attack Maternal Grandmother    Cervical cancer Maternal Grandmother    Heart attack Maternal Grandfather    Emphysema Paternal Grandmother     Social History Social History   Tobacco Use   Smoking status: Former    Current packs/day: 0.00    Average packs/day: 0.3 packs/day for 15.0 years (5.0 ttl pk-yrs)    Types: Cigarettes    Start date: 05/11/2005    Quit date: 05/11/2020    Years since quitting: 3.0   Smokeless tobacco: Never   Tobacco comments:    15 years smoking hx 1/3 ppd  Vaping Use   Vaping status: Never Used  Substance Use Topics   Alcohol use: Yes    Alcohol/week: 7.0 standard drinks of alcohol    Types: 7 Glasses of wine per week    Comment: 1 glass of wine   Drug use: No    Comment: pt denies during phone interview but UDS was + for marijuana in 2016     Allergies   Amoxicillin, Bactrim [sulfamethoxazole-trimethoprim], Penicillins, Denture adhesive, and Tape   Review of Systems Review of Systems  Constitutional:  Negative for fatigue and fever.  Respiratory:  Negative for shortness of breath.   Cardiovascular:  Negative for chest pain.  Musculoskeletal:  Positive for arthralgias and myalgias.  Skin:  Positive for rash. Negative for wound.  Neurological:  Negative for weakness.     Physical Exam Triage Vital Signs ED Triage Vitals  Encounter Vitals Group     BP 05/13/23 1157 112/80     Systolic BP Percentile --      Diastolic BP Percentile --      Pulse Rate 05/13/23 1157 91     Resp 05/13/23 1157 14     Temp 05/13/23 1157 98.5 F (36.9 C)     Temp Source 05/13/23 1157 Oral     SpO2 05/13/23 1157 98 %     Weight 05/13/23 1156 120 lb (54.4 kg)      Height 05/13/23 1156 5\' 5"  (1.651 m)     Head Circumference --      Peak Flow --      Pain Score 05/13/23 1155 10     Pain Loc --      Pain  Education --      Exclude from [employer] Chart --    No data found.  Updated Vital Signs BP 112/80 (BP Location: Right Arm)   Pulse 91   Temp 98.5 F (36.9 C) (Oral)   Resp 14   Ht 5\' 5"  (1.651 m)   Wt 120 lb (54.4 kg)   SpO2 98%   BMI 19.97 kg/m   Physical Exam Vitals and nursing note reviewed.  Constitutional:      General: She is not in acute distress.    Appearance: Normal appearance. She is not ill-appearing or toxic-appearing.  HENT:     Head: Normocephalic and atraumatic.     Nose: Nose normal.     Mouth/Throat:     Mouth: Mucous membranes are moist.     Pharynx: Oropharynx is clear.  Eyes:     General: No scleral icterus.       Right eye: No discharge.        Left eye: No discharge.     Conjunctiva/sclera: Conjunctivae normal.  Cardiovascular:     Rate and Rhythm: Normal rate and regular rhythm.     Heart sounds: Normal heart sounds.  Pulmonary:     Effort: Pulmonary effort is normal. No respiratory distress.     Breath sounds: Normal breath sounds.  Musculoskeletal:     Cervical back: Neck supple.  Skin:    General: Skin is dry.     Findings: Rash (vesicular patches on erythemaous base of left neck, left anterior chest and left shoulder) present.  Neurological:     General: No focal deficit present.     Mental Status: She is alert. Mental status is at baseline.     Motor: No weakness.     Gait: Gait normal.  Psychiatric:        Mood and Affect: Mood normal.        Behavior: Behavior normal.      UC Treatments / Results  Labs (all labs ordered are listed, but only abnormal results are displayed) Labs Reviewed - No data to display  EKG   Radiology No results found.  Procedures Procedures (including critical care time)  Medications Ordered in UC Medications - No data to display  Initial Impression /  Assessment and Plan / UC Course  I have reviewed the triage vital signs and the nursing notes.  Pertinent labs & imaging results that were available during my care of the patient were reviewed by me and considered in my medical decision making (see chart for details).   38 year old female presents for painful vesicular rash of the left neck, left chest and left shoulder x 2 days.  Examination consistent with suspected shingles.  Reviewed current CDC guidelines, isolation protocol and ED precautions for shingles.  Explained who patient is contagious to.  Sent Valtrex to pharmacy.  Advised to continue ibuprofen and/or Tylenol as needed for pain relief.  Hydrocodone sent for severe breakthrough pain.  Reviewed return and ED precautions.   Final Clinical Impressions(s) / UC Diagnoses   Final diagnoses:  Herpes zoster without complication  Rash     Discharge Instructions      -You have shingles.  You are technically contagious to anyone who has not had chickenpox.  Avoid pregnant women, babies, unvaccinated children and adults, immunocompromise in elderly persons.  You are contagious until your rash crusts over which can take 7 to 10 days. - You may take Tylenol or Advil as needed for pain relief.  I  sent Norco as needed for severe pain. - May apply topical lidocaine to the rash. - Return as needed.   ED Prescriptions     Medication Sig Dispense Auth. Provider   valACYclovir (VALTREX) 1000 MG tablet Take 1 tablet (1,000 mg total) by mouth 3 (three) times daily. 21 tablet Eusebio Friendly B, PA-C   HYDROcodone-acetaminophen (NORCO) 5-325 MG tablet Take 1 tablet by mouth every 8 (eight) hours as needed for up to 5 days for moderate pain (pain score 4-6). 15 tablet Shirlee Latch, PA-C      I have reviewed the PDMP during this encounter.   Shirlee Latch, PA-C 05/13/23 1244

## 2023-05-13 NOTE — Discharge Instructions (Addendum)
-  You have shingles.  You are technically contagious to anyone who has not had chickenpox.  Avoid pregnant women, babies, unvaccinated children and adults, immunocompromise in elderly persons.  You are contagious until your rash crusts over which can take 7 to 10 days. - You may take Tylenol or Advil as needed for pain relief.  I sent Norco as needed for severe pain. - May apply topical lidocaine to the rash. - Return as needed.

## 2023-05-14 ENCOUNTER — Emergency Department
Admission: EM | Admit: 2023-05-14 | Discharge: 2023-05-14 | Disposition: A | Discharge status: Home / Self Care | Payer: BC Managed Care – PPO | Attending: Emergency Medicine | Admitting: Emergency Medicine

## 2023-05-14 ENCOUNTER — Emergency Department: Payer: BC Managed Care – PPO

## 2023-05-14 ENCOUNTER — Other Ambulatory Visit: Payer: Self-pay

## 2023-05-14 DIAGNOSIS — R531 Weakness: Secondary | ICD-10-CM | POA: Diagnosis not present

## 2023-05-14 DIAGNOSIS — J9811 Atelectasis: Secondary | ICD-10-CM | POA: Diagnosis not present

## 2023-05-14 DIAGNOSIS — R002 Palpitations: Secondary | ICD-10-CM | POA: Diagnosis not present

## 2023-05-14 LAB — CBC
HCT: 40.9 % (ref 36.0–46.0)
Hemoglobin: 13.3 g/dL (ref 12.0–15.0)
MCH: 29.5 pg (ref 26.0–34.0)
MCHC: 32.5 g/dL (ref 30.0–36.0)
MCV: 90.7 fL (ref 80.0–100.0)
Platelets: 277 10*3/uL (ref 150–400)
RBC: 4.51 MIL/uL (ref 3.87–5.11)
RDW: 13.2 % (ref 11.5–15.5)
WBC: 4.7 10*3/uL (ref 4.0–10.5)
nRBC: 0 % (ref 0.0–0.2)

## 2023-05-14 LAB — BASIC METABOLIC PANEL
Anion gap: 9 (ref 5–15)
BUN: 17 mg/dL (ref 6–20)
CO2: 24 mmol/L (ref 22–32)
Calcium: 9.3 mg/dL (ref 8.9–10.3)
Chloride: 105 mmol/L (ref 98–111)
Creatinine, Ser: 0.64 mg/dL (ref 0.44–1.00)
GFR, Estimated: >60 mL/min (ref >60)
Glucose, Bld: 119 mg/dL — ABNORMAL HIGH (ref 70–99)
Potassium: 3.3 mmol/L — ABNORMAL LOW (ref 3.5–5.1)
Sodium: 138 mmol/L (ref 135–145)

## 2023-05-14 LAB — TROPONIN I (HIGH SENSITIVITY): Troponin I (High Sensitivity): <2 ng/L (ref <18)

## 2023-05-14 MED ORDER — HYDROXYZINE HCL 25 MG PO TABS: 25.0000 mg | ORAL_TABLET | Freq: Three times a day (TID) | ORAL | 0 refills | Status: DC | PRN

## 2023-05-14 NOTE — Discharge Instructions (Signed)
Please call the number provided for cardiology to arrange a follow-up appointment for further evaluation and consideration of a Holter monitor.  Your workup in the emergency department today showed reassuring results.  Please take your prescribed hydroxyzine if needed for anxiety type symptoms.  Return to the emergency department for any chest pain trouble breathing or any other symptom personally concerning to yourself.

## 2023-05-14 NOTE — ED Triage Notes (Signed)
C/O palpitations this morning.

## 2023-05-14 NOTE — ED Provider Notes (Signed)
[facility] Provider Note    Event Date/Time   First MD Initiated Contact with Patient 05/14/23 1428     (approximate)  History   Chief Complaint: Palpitations  HPI  Leslie [name] is a 38 y.o. female with a past medical history of anxiety, presents to the emergency department for palpitations.  According to the patient she has been experiencing palpitations for quite some time but states they are intermittent and brief.  Patient has been prescribed propranolol by her doctor for the same.  Patient states since last night she has been experiencing more frequent palpitations, tried the propranolol but still had palpitations she was worried so she came to the emergency department for evaluation.  Patient denies any chest pain denies any shortness of breath nausea or diaphoresis.  Physical Exam   Triage Vital Signs: ED Triage Vitals  Encounter Vitals Group     BP 05/14/23 1209 (!) 148/103     Systolic BP Percentile --      Diastolic BP Percentile --      Pulse Rate 05/14/23 1209 (!) 102     Resp 05/14/23 1209 15     Temp 05/14/23 1209 98.2 F (36.8 C)     Temp Source 05/14/23 1209 Oral     SpO2 05/14/23 1209 100 %     Weight 05/14/23 1206 119 lb 0.8 oz (54 kg)     Height --      Head Circumference --      Peak Flow --      Pain Score 05/14/23 1206 0     Pain Loc --      Pain Education --      Exclude from Growth Chart --     Most recent vital signs: Vitals:   05/14/23 1209  BP: (!) 148/103  Pulse: (!) 102  Resp: 15  Temp: 98.2 F (36.8 C)  SpO2: 100%    General: Awake, no distress.  CV:  Good peripheral perfusion.  Regular rate and rhythm  Resp:  Normal effort.  Equal breath sounds bilaterally.  Abd:  No distention.  Soft, nontender.  No rebound or guarding.  ED Results / Procedures / Treatments   EKG  EKG viewed and interpreted by myself shows sinus tachycardia 103 bpm with a narrow QRS, normal axis, normal intervals, nonspecific ST  changes.  No ST elevation.  RADIOLOGY  I have reviewed and interpreted the patient's chest x-ray images.  No consolidation seen on my evaluation. Radiology is read the chest x-ray as atelectasis without acute abnormality.   MEDICATIONS ORDERED IN ED: Medications - No data to display   IMPRESSION / MDM / ASSESSMENT AND PLAN / ED COURSE  I reviewed the triage vital signs and the nursing notes.  Patient's presentation is most consistent with acute presentation with potential threat to life or bodily function.  Patient presents to the emergency department for palpitation.  Patient's workup is reassuring, chest x-ray is clear, EKG shows nonspecific but no concerning findings.  Lab work is reassuring with normal CBC, normal chemistry and a negative troponin.  Highly suspect palpitations likely PACs given 1 PAC on the patient's EKG.  Patient states she is prescribed hydroxyzine but ran out of that medication.  I will refill the patient's hydroxyzine for her.  I discussed follow-up with a cardiologist for consideration of a Holter monitor.  However given the patient's reassuring workup reassuring vital signs and reassuring physical exam I believe the patient is safe for  discharge home with outpatient follow-up.  Patient is agreeable to this plan  FINAL CLINICAL IMPRESSION(S) / ED DIAGNOSES   Palpitations  Note:  This document was prepared using Dragon voice recognition software and may include unintentional dictation errors.   Minna Antis, MD 05/14/23 [phone]

## 2023-05-14 NOTE — ED Provider Triage Note (Signed)
Emergency Medicine Provider Triage Evaluation Note  Leslie [name] , a 38 y.o. female  was evaluated in triage.  Pt complains of palpitations, weakness, light headed.   Review of Systems  Positive: Palpitations Negative: No N,V,D  Physical Exam  BP (!) 148/103 (BP Location: Left Arm)   Pulse (!) 102   Temp 98.2 F (36.8 C) (Oral)   Resp 15   Wt 54 kg   SpO2 100%   BMI 19.81 kg/m  Gen:   Awake, no distress   talkative Resp:  Normal effort  Lungs clear   Heart RRR MSK:   Moves extremities without difficulty  Other:    Medical Decision Making  Medically screening exam initiated at 12:20 PM.  Appropriate orders placed.  Anjalina Bergevin Clinkscale was informed that the remainder of the evaluation will be completed by another provider, this initial triage assessment does not replace that evaluation, and the importance of remaining in the ED until their evaluation is complete.     Tommi Rumps, PA-C 05/14/23 1222

## 2023-12-31 ENCOUNTER — Encounter: Payer: BC Managed Care – PPO | Admitting: Nurse Practitioner

## 2024-01-08 ENCOUNTER — Ambulatory Visit (INDEPENDENT_AMBULATORY_CARE_PROVIDER_SITE_OTHER): Payer: BC Managed Care – PPO | Admitting: Nurse Practitioner

## 2024-01-08 ENCOUNTER — Encounter: Payer: Self-pay | Admitting: Nurse Practitioner

## 2024-01-08 ENCOUNTER — Other Ambulatory Visit (HOSPITAL_COMMUNITY)
Admission: RE | Admit: 2024-01-08 | Discharge: 2024-01-08 | Disposition: A | Discharge status: Home / Self Care | Source: Ambulatory Visit | Attending: Nurse Practitioner | Admitting: Nurse Practitioner

## 2024-01-08 VITALS — BP 118/78 | HR 76 | Resp 16 | Ht 65.0 in | Wt 129.0 lb

## 2024-01-08 DIAGNOSIS — Z13220 Encounter for screening for lipoid disorders: Secondary | ICD-10-CM | POA: Diagnosis not present

## 2024-01-08 DIAGNOSIS — Z0000 Encounter for general adult medical examination without abnormal findings: Secondary | ICD-10-CM

## 2024-01-08 DIAGNOSIS — Z1231 Encounter for screening mammogram for malignant neoplasm of breast: Secondary | ICD-10-CM

## 2024-01-08 DIAGNOSIS — Z124 Encounter for screening for malignant neoplasm of cervix: Secondary | ICD-10-CM | POA: Diagnosis not present

## 2024-01-08 DIAGNOSIS — Z130 Encounter for screening for diseases of the blood and blood-forming organs and certain disorders involving the immune mechanism: Secondary | ICD-10-CM

## 2024-01-08 DIAGNOSIS — Z131 Encounter for screening for diabetes mellitus: Secondary | ICD-10-CM

## 2024-01-08 DIAGNOSIS — R1084 Generalized abdominal pain: Secondary | ICD-10-CM | POA: Diagnosis not present

## 2024-01-08 NOTE — Progress Notes (Signed)
 Name: Leslie [name]   MRN: [id number]    DOB: 1986-02-29   Date:01/08/2024       Progress Note  Subjective  Chief Complaint  Chief Complaint  Patient presents with   Annual Exam    HPI  Patient presents for annual CPE. Discussed the use of AI scribe software for clinical note transcription with the patient, who gave verbal consent to proceed.  History of Present Illness Leslie [name] is a 38 year old female who presents for an annual physical exam.  Contraception and sexual activity - Currently has an intrauterine device (IUD) in place - Considering IUD removal - Sexually active  Urinary incontinence - Experiences urinary incontinence only when laughing hard  Mood disturbance and sleep disruption - Positive depression screening - Inconsistent sleep with frequent nighttime awakenings - Feels hot during the night  Weight gain and physical activity - Weight has increased slightly since February - Does not engage in regular exercise due to a busy work schedule  Ocular symptoms - Experiences unspecified issues with her eyes - No recent eye exam  Dental care - No recent dental exam   Abdominal tenderness/changes in bowel movements -patient reports for some time now she has had worsening gerd and abdominal tenderness.  She also reports that her stools are different. She says that sometimes they are normal and other times look like alien poop.  She reports sometimes she wakes up in the morning feeling nauseas.    Diet: well balanced diet Exercise: works a lot, recommend 150 min of physical activity weekly    Sleep: wakes up through out the night Last dental exam:due Last eye exam: due  Flowsheet Row Office Visit from 01/08/2024 in [facility]  AUDIT-C Score 0   Depression: Phq 9 is  positive    01/08/2024    8:20 AM 12/29/2022    9:56 AM 07/21/2022    2:56 PM 01/04/2022   10:28 AM 12/26/2021   10:12 AM  Depression screen PHQ 2/9  Decreased  Interest 0 1 0 0 0  Down, Depressed, Hopeless 0 1 0 0 1  PHQ - 2 Score 0 2 0 0 1  Altered sleeping 1 1 0 0 0  Tired, decreased energy 3 1 0 0 0  Change in appetite 0 0 0 0 0  Feeling bad or failure about yourself  0 1 0 0 0  Trouble concentrating 0 1 0 0 0  Moving slowly or fidgety/restless 0 0 0 0 0  Suicidal thoughts 0 0 0 0 0  PHQ-9 Score 4 6 0 0 1  Difficult doing work/chores  Not difficult at all Not difficult at all Not difficult at all Somewhat difficult   Hypertension: BP Readings from Last 3 Encounters:  01/08/24 118/78  05/14/23 (!) 148/103  05/13/23 112/80   Obesity: Wt Readings from Last 3 Encounters:  01/08/24 129 lb (58.5 kg)  05/14/23 119 lb 0.8 oz (54 kg)  05/13/23 120 lb (54.4 kg)   BMI Readings from Last 3 Encounters:  01/08/24 21.47 kg/m  05/14/23 19.81 kg/m  05/13/23 19.97 kg/m     Vaccines:  HPV: up to at age 30 , ask insurance if age between 74-45  Shingrix: 62-64 yo and ask insurance if covered when patient above 7 yo Pneumonia:  educated and discussed with patient. Flu:  educated and discussed with patient.  Hep C Screening: completed STD testing and prevention (HIV/chl/gon/syphilis): completed Intimate partner violence:none Sexual History : sexually  active Menstrual History/LMP/Abnormal Bleeding: IUD Incontinence Symptoms: none  Breast cancer:  - Last Mammogram: due, order placed - BRCA gene screening: none  Osteoporosis: Discussed high calcium and vitamin D supplementation, weight bearing exercises  Cervical cancer screening: 10/10/2018, due  Skin cancer: Discussed monitoring for atypical lesions  Colorectal cancer: does not qualify   Lung cancer:  = Low Dose CT Chest recommended if Age 26-80 years, 20 pack-year currently smoking OR have quit w/in 15years. Patient does not qualify.   ECG: 05/15/2023  Advanced Care Planning: A voluntary discussion about advance care planning including the explanation and discussion of advance  directives.  Discussed health care proxy and Living will, and the patient was able to identify a health care proxy as Mom.  Patient does not have a living will at present time. If patient does have living will, I have requested they bring this to the clinic to be scanned in to their chart.  Lipids: Lab Results  Component Value Date   CHOL 172 07/21/2022   CHOL 176 05/03/2021   CHOL 182 12/23/2020   Lab Results  Component Value Date   HDL 67 07/21/2022   HDL 58 05/03/2021   HDL 74 12/23/2020   Lab Results  Component Value Date   LDLCALC 89 07/21/2022   LDLCALC 97 05/03/2021   LDLCALC 91 12/23/2020   Lab Results  Component Value Date   TRIG 70 07/21/2022   TRIG 114 05/03/2021   TRIG 76 12/23/2020   Lab Results  Component Value Date   CHOLHDL 2.6 07/21/2022   CHOLHDL 3.0 05/03/2021   CHOLHDL 2.5 12/23/2020   No results found for: LDLDIRECT  Glucose: Glucose  Date Value Ref Range Status  01/09/2014 81 65 - 99 mg/dL Final  98/71/7984 93 65 - 99 mg/dL Final  98/97/7984 74 65 - 99 mg/dL Final   Glucose, Bld  Date Value Ref Range Status  05/14/2023 119 (H) 70 - 99 mg/dL Final    Comment:    Glucose reference range applies only to samples taken after fasting for at least 8 hours.  07/21/2022 92 65 - 99 mg/dL Final    Comment:    .            Fasting reference interval .   05/03/2021 90 65 - 99 mg/dL Final    Comment:    .            Fasting reference interval .     Patient Active Problem List   Diagnosis Date Noted   Viral URI with cough 04/01/2023   Moderate episode of recurrent major depressive disorder (HCC) 12/07/2021   Anxiety 12/07/2021   Insomnia due to other mental disorder 07/06/2021   Severe depression (HCC) 05/17/2021   Panic anxiety syndrome 05/03/2021   Hyperlipidemia 12/23/2019   Family history of thyroid  disease 12/23/2019   Family history of diabetes mellitus in mother 12/23/2019   Gastroesophageal reflux disease without esophagitis  09/09/2018   Lumbosacral radiculopathy 01/07/2015    Past Surgical History:  Procedure Laterality Date   BREAST SURGERY     augmentation   CESAREAN SECTION  2005   CESAREAN SECTION  2016   CESAREAN SECTION  2005, 2016   CHOLECYSTECTOMY N/A 02/14/2016   Procedure: LAPAROSCOPIC CHOLECYSTECTOMY WITH INTRAOPERATIVE CHOLANGIOGRAM;  Surgeon: Reyes LELON Cota, MD;  Location: [facility] ORS;  Service: General;  Laterality: N/A;   TONSILLECTOMY AND ADENOIDECTOMY  1987   TONSILLECTOMY AND ADENOIDECTOMY  1987    Family History  Problem  Relation Age of Onset   Diabetes Mother    Thyroid  disease Maternal Aunt    Ovarian cancer Maternal Grandmother    Heart attack Maternal Grandmother    Cervical cancer Maternal Grandmother    Heart attack Maternal Grandfather    Emphysema Paternal Grandmother     Social History   Socioeconomic History   Marital status: Married    Spouse name: Christopher   Number of children: 2   Years of education: 11   Highest education level: 11th grade  Occupational History   Not on file  Tobacco Use   Smoking status: Former    Current packs/day: 0.00    Average packs/day: 0.3 packs/day for 15.0 years (5.0 ttl pk-yrs)    Types: Cigarettes    Start date: 05/11/2005    Quit date: 05/11/2020    Years since quitting: 3.6   Smokeless tobacco: Never   Tobacco comments:    15 years smoking hx 1/3 ppd  Vaping Use   Vaping status: Never Used  Substance and Sexual Activity   Alcohol use: Yes    Alcohol/week: 7.0 standard drinks of alcohol    Types: 7 Glasses of wine per week    Comment: 1 glass of wine   Drug use: No    Comment: pt denies during phone interview but UDS was + for marijuana in 2016   Sexual activity: Yes    Birth control/protection: I.U.D.  Other Topics Concern   Not on file  Social History Narrative   Not on file   Social Drivers of Health   Financial Resource Strain: Low Risk  (01/08/2024)   Overall Financial Resource Strain (CARDIA)    Difficulty of  Paying Living Expenses: Not hard at all  Food Insecurity: No Food Insecurity (01/08/2024)   Hunger Vital Sign    Worried About Running Out of Food in the Last Year: Never true    Ran Out of Food in the Last Year: Never true  Transportation Needs: No Transportation Needs (01/08/2024)   PRAPARE - Administrator, Civil Service (Medical): No    Lack of Transportation (Non-Medical): No  Physical Activity: Inactive (01/08/2024)   Exercise Vital Sign    Days of Exercise per Week: 0 days    Minutes of Exercise per Session: 0 min  Stress: No Stress Concern Present (01/08/2024)   [employer] of Occupational Health - Occupational Stress Questionnaire    Feeling of Stress: Only a little  Social Connections: Moderately Integrated (01/08/2024)   Social Connection and Isolation Panel    Frequency of Communication with Friends and Family: More than three times a week    Frequency of Social Gatherings with Friends and Family: More than three times a week    Attends Religious Services: More than 4 times per year    Active Member of [employer] or Organizations: No    Attends Banker Meetings: Never    Marital Status: Married  Catering manager Violence: Not At Risk (01/08/2024)   Humiliation, Afraid, Rape, and Kick questionnaire    Fear of Current or Ex-Partner: No    Emotionally Abused: No    Physically Abused: No    Sexually Abused: No     Current Outpatient Medications:    busPIRone  (BUSPAR ) 5 MG tablet, TAKE 1-2 TABLETS (5-10 MG TOTAL) BY MOUTH 2 (TWO) TIMES DAILY AS NEEDED (ANXIETY SYMTPOMS)., Disp: 360 tablet, Rfl: 1   famotidine  (PEPCID ) 20 MG tablet, Take 1 tablet (20 mg total) by mouth  2 (two) times daily., Disp: 120 tablet, Rfl: 1   fluticasone  (FLONASE ) 50 MCG/ACT nasal spray, SPRAY 2 SPRAYS INTO EACH NOSTRIL EVERY DAY, Disp: 48 mL, Rfl: 1   hydrOXYzine  (ATARAX ) 25 MG tablet, Take 1 tablet (25 mg total) by mouth 3 (three) times daily as needed., Disp: 30 tablet, Rfl:  0   levocetirizine (XYZAL ) 5 MG tablet, TAKE 1 TABLET BY MOUTH EVERY DAY IN THE EVENING, Disp: 90 tablet, Rfl: 1   levonorgestrel (MIRENA) 20 MCG/24HR IUD, 1 each by Intrauterine route once., Disp: , Rfl:    omeprazole  (PRILOSEC) 20 MG capsule, TAKE 1 CAPSULE BY MOUTH EVERY DAY, Disp: 90 capsule, Rfl: 3   propranolol  (INDERAL ) 10 MG tablet, TAKE 1 TABLET BY MOUTH THREE TIMES A DAY AS NEEDED, Disp: 90 tablet, Rfl: 0   valACYclovir  (VALTREX ) 1000 MG tablet, Take 1 tablet (1,000 mg total) by mouth 3 (three) times daily., Disp: 21 tablet, Rfl: 0  Allergies  Allergen Reactions   Amoxicillin Hives    Has patient had a PCN reaction causing immediate rash, facial/tongue/throat swelling, SOB or lightheadedness with hypotension: {no Has patient had a PCN reaction causing severe rash involving mucus membranes or skin necrosis: no Has patient had a PCN reaction that required hospitalization no Has patient had a PCN reaction occurring within the last 10 years: no If all of the above answers are NO, then may proceed with Cephalosporin use.   Bactrim [Sulfamethoxazole-Trimethoprim] Hives   Penicillins Hives    Has patient had a PCN reaction causing immediate rash, facial/tongue/throat swelling, SOB or lightheadedness with hypotension: {no Has patient had a PCN reaction causing severe rash involving mucus membranes or skin necrosis: {no Has patient had a PCN reaction that required hospitalization no Has patient had a PCN reaction occurring within the last 10 years: {no If all of the above answers are NO, then may proceed with Cephalosporin use.   Denture Adhesive Rash   Tape Rash    PT TOLERATES PAPER TAPE WELL     ROS  Constitutional: Negative for fever or weight change.  Respiratory: Negative for cough and shortness of breath.   Cardiovascular: Negative for chest pain or palpitations.  Gastrointestinal: positive for abdominal pain, no bowel changes.  Musculoskeletal: Negative for gait problem  or joint swelling.  Skin: Negative for rash.  Neurological: Negative for dizziness or headache.  No other specific complaints in a complete review of systems (except as listed in HPI above).   Objective  Vitals:   01/08/24 0816  BP: 118/78  Pulse: 76  Resp: 16  SpO2: 99%  Weight: 129 lb (58.5 kg)  Height: 5' 5 (1.651 m)    Body mass index is 21.47 kg/m.  Physical Exam Vitals reviewed. Exam conducted with a chaperone present.  Constitutional:      Appearance: Normal appearance.  HENT:     Head: Normocephalic.     Right Ear: Tympanic membrane normal.     Left Ear: Tympanic membrane normal.     Nose: Nose normal.  Eyes:     Extraocular Movements: Extraocular movements intact.     Conjunctiva/sclera: Conjunctivae normal.     Pupils: Pupils are equal, round, and reactive to light.  Neck:     Thyroid : No thyroid  mass, thyromegaly or thyroid  tenderness.  Cardiovascular:     Rate and Rhythm: Normal rate and regular rhythm.     Pulses: Normal pulses.     Heart sounds: Normal heart sounds.  Pulmonary:     Effort:  Pulmonary effort is normal.     Breath sounds: Normal breath sounds.  Chest:  Breasts:    Right: Normal.     Left: Normal.  Abdominal:     General: Bowel sounds are normal. There is no distension.     Palpations: Abdomen is soft.     Tenderness: There is abdominal tenderness. There is no guarding or rebound.  Genitourinary:    Vagina: Normal.     Cervix: Normal.     Uterus: Normal.      Adnexa: Right adnexa normal and left adnexa normal.     Comments: Iud strings visualized Musculoskeletal:        General: Normal range of motion.     Cervical back: Normal range of motion and neck supple.     Right lower leg: No edema.     Left lower leg: No edema.  Skin:    General: Skin is warm and dry.     Capillary Refill: Capillary refill takes less than 2 seconds.  Neurological:     General: No focal deficit present.     Mental Status: She is alert and oriented  to person, place, and time. Mental status is at baseline.  Psychiatric:        Mood and Affect: Mood normal.        Behavior: Behavior normal.        Thought Content: Thought content normal.        Judgment: Judgment normal.     Fall Risk:    01/08/2024    8:15 AM 12/29/2022    9:55 AM 07/21/2022    2:56 PM 01/04/2022   10:27 AM 12/26/2021   10:12 AM  Fall Risk   Falls in the past year? 0 0 0 0 0  Number falls in past yr: 0 0 0 0 0  Injury with Fall? 0 0 0 0 0  Risk for fall due to : No Fall Risks No Fall Risks  No Fall Risks No Fall Risks  Follow up Falls prevention discussed Falls prevention discussed  Falls prevention discussed;Education provided  Falls prevention discussed;Education provided      Data saved with a previous flowsheet row definition     Functional Status Survey: Is the patient deaf or have difficulty hearing?: No Does the patient have difficulty seeing, even when wearing glasses/contacts?: No Does the patient have difficulty concentrating, remembering, or making decisions?: No Does the patient have difficulty walking or climbing stairs?: No Does the patient have difficulty dressing or bathing?: No Does the patient have difficulty doing errands alone such as visiting a doctor's office or shopping?: No   Assessment & Plan  Problem List Items Addressed This Visit   None Visit Diagnoses       Annual physical exam    -  Primary   Relevant Orders   MM 3D SCREENING MAMMOGRAM BILATERAL BREAST   CBC with Differential/Platelet   Comprehensive metabolic panel with GFR   Lipid panel   Hemoglobin A1c   TSH     Encounter for screening mammogram for malignant neoplasm of breast       Relevant Orders   MM 3D SCREENING MAMMOGRAM BILATERAL BREAST     Screening for deficiency anemia       Relevant Orders   CBC with Differential/Platelet     Screening for cholesterol level       Relevant Orders   Lipid panel     Screening for diabetes mellitus  Relevant  Orders   Comprehensive metabolic panel with GFR   Hemoglobin A1c     Screening for cervical cancer       Relevant Orders   Cytology - PAP     Generalized abdominal pain       Relevant Orders   H. pylori breath test   CALPROTECTIN   Celiac Disease Panel   Lipase      Assessment and Plan Assessment & Plan Adult Wellness Visit Routine annual physical examination. Blood pressure is 118/78 mmHg. Weight has increased slightly since February. IUD in place and needs removal. - Provide mammogram referral card for self-scheduling - Order basic blood work including lipid panel and A1c  Obesity Weight has increased slightly since February.  Depression Positive depression screening.  Sleep disturbance Reports inconsistent sleep with frequent awakenings and episodes of waking up feeling hot.  Vision problems Reports vision problems. Last eye exam was overdue.  Abdominal tenderness -lipase, cbc, cmp, celiac panel ordered, h. Pylori test ordered -consider referral to GI    -USPSTF grade A and B recommendations reviewed with patient; age-appropriate recommendations, preventive care, screening tests, etc discussed and encouraged; healthy living encouraged; see AVS for patient education given to patient -Discussed importance of 150 minutes of physical activity weekly, eat two servings of fish weekly, eat one serving of tree nuts ( cashews, pistachios, pecans, almonds.SABRA) every other day, eat 6 servings of fruit/vegetables daily and drink plenty of water and avoid sweet beverages.   -Reviewed Health Maintenance: yes

## 2024-01-09 ENCOUNTER — Ambulatory Visit: Payer: Self-pay | Admitting: Nurse Practitioner

## 2024-01-10 LAB — LIPID PANEL
Cholesterol: 194 mg/dL (ref <200)
HDL: 82 mg/dL (ref >=50)
LDL Cholesterol (Calc): 97 mg/dL
Non-HDL Cholesterol (Calc): 112 mg/dL (ref <130)
Total CHOL/HDL Ratio: 2.4 (calc) (ref <5.0)
Triglycerides: 61 mg/dL (ref <150)

## 2024-01-10 LAB — COMPREHENSIVE METABOLIC PANEL WITH GFR
AG Ratio: 1.9 (calc) (ref 1.0–2.5)
ALT: 14 U/L (ref 6–29)
AST: 16 U/L (ref 10–30)
Albumin: 4.6 g/dL (ref 3.6–5.1)
Alkaline phosphatase (APISO): 42 U/L (ref 31–125)
BUN: 15 mg/dL (ref 7–25)
CO2: 26 mmol/L (ref 20–32)
Calcium: 9.6 mg/dL (ref 8.6–10.2)
Chloride: 107 mmol/L (ref 98–110)
Creat: 0.79 mg/dL (ref 0.50–0.99)
Globulin: 2.4 g/dL (ref 1.9–3.7)
Glucose, Bld: 114 mg/dL — ABNORMAL HIGH (ref 65–99)
Potassium: 4.8 mmol/L (ref 3.5–5.3)
Sodium: 139 mmol/L (ref 135–146)
Total Bilirubin: 0.4 mg/dL (ref 0.2–1.2)
Total Protein: 7 g/dL (ref 6.1–8.1)
eGFR: 97 mL/min/1.73m2 (ref >=60)

## 2024-01-10 LAB — CBC WITH DIFFERENTIAL/PLATELET
Absolute Lymphocytes: 1065 {cells}/uL (ref 850–3900)
Absolute Monocytes: 413 {cells}/uL (ref 200–950)
Basophils Absolute: 30 {cells}/uL (ref 0–200)
Basophils Relative: 0.4 %
Eosinophils Absolute: 30 {cells}/uL (ref 15–500)
Eosinophils Relative: 0.4 %
HCT: 41.5 % (ref 35.0–45.0)
Hemoglobin: 13.4 g/dL (ref 11.7–15.5)
MCH: 29.6 pg (ref 27.0–33.0)
MCHC: 32.3 g/dL (ref 32.0–36.0)
MCV: 91.8 fL (ref 80.0–100.0)
MPV: 10.6 fL (ref 7.5–12.5)
Monocytes Relative: 5.5 %
Neutro Abs: 5963 {cells}/uL (ref 1500–7800)
Neutrophils Relative %: 79.5 %
Platelets: 281 Thousand/uL (ref 140–400)
RBC: 4.52 Million/uL (ref 3.80–5.10)
RDW: 12.4 % (ref 11.0–15.0)
Total Lymphocyte: 14.2 %
WBC: 7.5 Thousand/uL (ref 3.8–10.8)

## 2024-01-10 LAB — HEMOGLOBIN A1C
Hgb A1c MFr Bld: 5.3 % (ref <5.7)
Mean Plasma Glucose: 105 mg/dL
eAG (mmol/L): 5.8 mmol/L

## 2024-01-10 LAB — CELIAC DISEASE PANEL
(tTG) Ab, IgA: <1 U/mL
(tTG) Ab, IgG: <1 U/mL
Gliadin IgA: <1 U/mL
Gliadin IgG: <1 U/mL
Immunoglobulin A: 133 mg/dL (ref 47–310)

## 2024-01-10 LAB — LIPASE: Lipase: 101 U/L — ABNORMAL HIGH (ref 7–60)

## 2024-01-10 LAB — H. PYLORI BREATH TEST: H. pylori Breath Test: NOT DETECTED

## 2024-01-10 LAB — TSH: TSH: 2.88 m[IU]/L

## 2024-01-14 LAB — CYTOLOGY - PAP
Adequacy: ABSENT
Comment: NEGATIVE
Diagnosis: NEGATIVE
High risk HPV: NEGATIVE

## 2024-01-15 IMAGING — CR DG CHEST 2V
2 series · 2 of 2 positions shown · non-contrast
Comparison: November 27, 2019

CLINICAL DATA: Cough with shortness of breath.

EXAM:
CHEST - 2 VIEW

[chest pa]
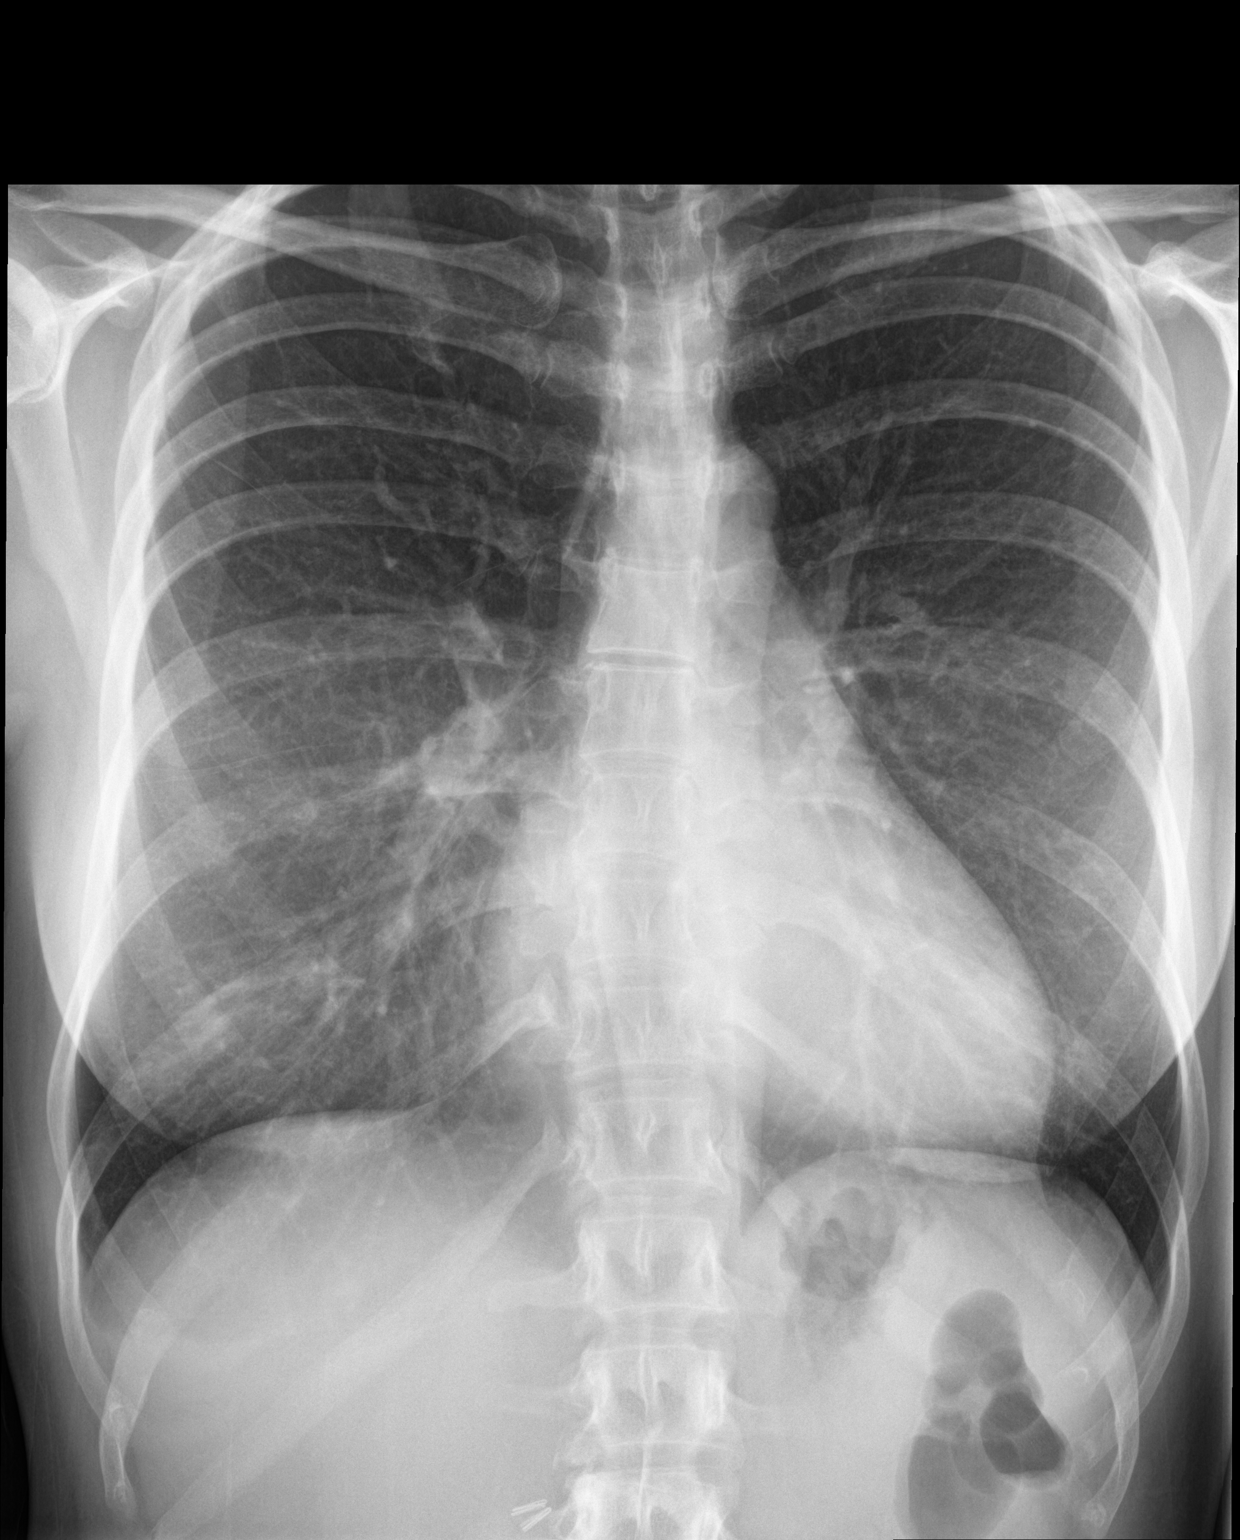

[chest lat]
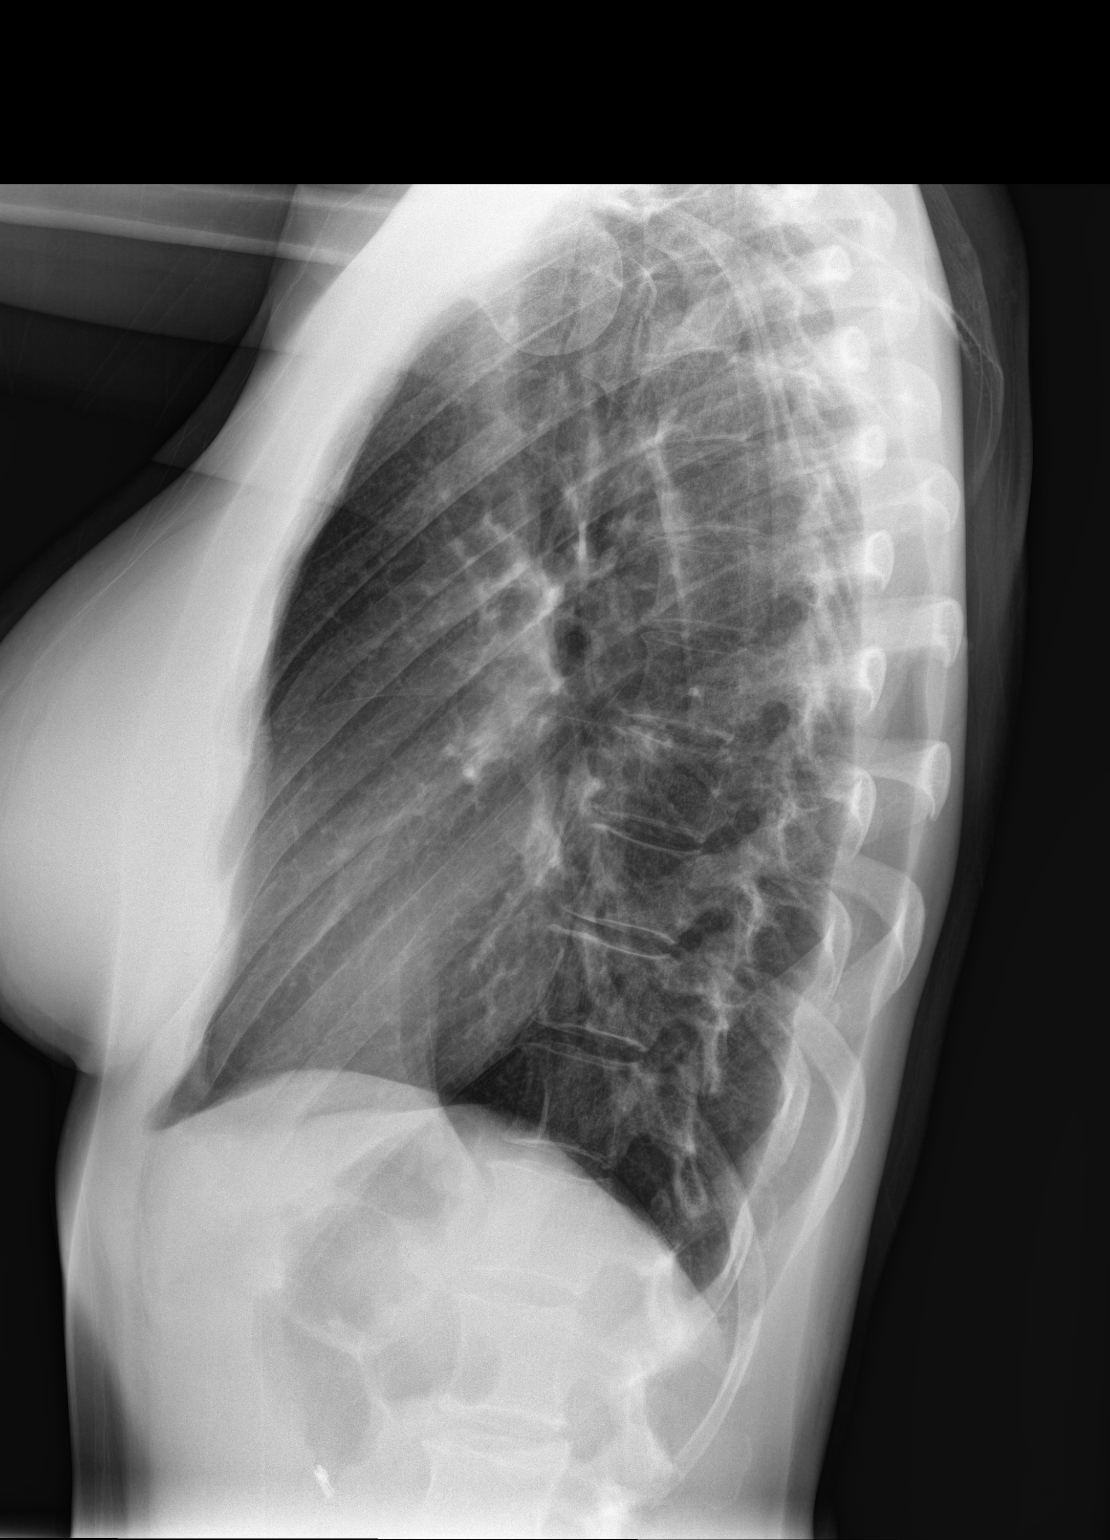

[2 of 2 positions shown; findings below may reference images not displayed]

FINDINGS: The heart size and mediastinal contours are within normal limits. No
focal airspace consolidation. No visible pleural effusion or
pneumothorax. The visualized skeletal structures are unremarkable.
IMPRESSION: No acute cardiopulmonary disease.

## 2024-04-30 ENCOUNTER — Telehealth: Admitting: Physician Assistant

## 2024-04-30 DIAGNOSIS — J039 Acute tonsillitis, unspecified: Secondary | ICD-10-CM

## 2024-04-30 MED ORDER — AZITHROMYCIN 250 MG PO TABS: ORAL_TABLET | ORAL | 0 refills | Status: AC

## 2024-04-30 NOTE — Progress Notes (Signed)

## 2025-01-08 ENCOUNTER — Encounter: Admitting: Nurse Practitioner
